# Patient Record
Sex: Male | Born: 1950 | Race: White | Hispanic: No | Marital: Married | Smoking: Former smoker
Health system: Southern US, Community
[De-identification: ages and names within clinical notes are randomized; demographics above are authoritative.]

## PROBLEM LIST (undated history)

## (undated) DIAGNOSIS — G4730 Sleep apnea, unspecified: Secondary | ICD-10-CM

## (undated) DIAGNOSIS — M1990 Unspecified osteoarthritis, unspecified site: Secondary | ICD-10-CM

## (undated) DIAGNOSIS — R51 Headache: Secondary | ICD-10-CM

## (undated) DIAGNOSIS — I10 Essential (primary) hypertension: Secondary | ICD-10-CM

## (undated) DIAGNOSIS — I499 Cardiac arrhythmia, unspecified: Secondary | ICD-10-CM

## (undated) DIAGNOSIS — R519 Headache, unspecified: Secondary | ICD-10-CM

---

## 1988-11-11 HISTORY — PX: VASECTOMY: SHX75

## 2011-11-12 HISTORY — PX: EYE SURGERY: SHX253

## 2015-07-05 ENCOUNTER — Ambulatory Visit: Payer: Worker's Compensation

## 2015-07-05 ENCOUNTER — Ambulatory Visit (INDEPENDENT_AMBULATORY_CARE_PROVIDER_SITE_OTHER): Payer: Worker's Compensation | Admitting: Family Medicine

## 2015-07-05 VITALS — BP 152/96 | HR 64 | Temp 98.6°F | Resp 17 | Ht 68.0 in | Wt 236.0 lb

## 2015-07-05 DIAGNOSIS — S61431A Puncture wound without foreign body of right hand, initial encounter: Secondary | ICD-10-CM

## 2015-07-05 DIAGNOSIS — S61209A Unspecified open wound of unspecified finger without damage to nail, initial encounter: Secondary | ICD-10-CM

## 2015-07-05 MED ORDER — CEPHALEXIN 500 MG PO CAPS
500.0000 mg | ORAL_CAPSULE | Freq: Two times a day (BID) | ORAL | Status: DC
Start: 2015-07-05 — End: 2017-08-29

## 2015-07-05 NOTE — Progress Notes (Signed)
Verbal consent obtained from patient.  Anesthesia by metacarpal block with 4cc Lidocaine 2% without epinephrine.  Wound scrubbed with soap and water and rinsed.  Wound closed with #1 4-0 Prolene loose simple interrupted suture.  Wound cleansed and dressed.

## 2015-07-05 NOTE — Progress Notes (Signed)
Subjective:  This chart was scribed for Meredith Staggers, MD by Andrew Au, ED Scribe. This patient was seen in room 9 and the patient's care was started at 9:07 AM.   Patient ID: Danny Andrews, male    DOB: August 26, 1951, 64 y.o.   MRN: [id number]  HPI Chief Complaint  Patient presents with  . Finger Injury    right finger     HPI Comments: Danny Andrews is a 64 y.o. male who presents to the [facility] for an injury that occurred at work about 2.5 hour ago. Pt works for  [employer]. 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 States he removed screw driver himself. He did have bleeding after removing screwdriver but covered wound and applied pressure. Pt is right hand dominant. Last tetanus was 5 years ago.  No Known Allergies Prior to Admission medications   Medication Sig Start Date End Date Taking? Authorizing Provider  aspirin 325 MG tablet Take 325 mg by mouth daily.   Yes Historical Provider, MD   Review of Systems  Skin: Positive for wound.  Neurological: Negative for weakness and numbness.    Objective:   Physical Exam  Constitutional: He is oriented to person, place, and time. He appears well-developed and well-nourished. No distress.  HENT:  Head: Normocephalic and atraumatic.  Eyes: Conjunctivae and EOM are normal.  Neck: Neck supple.  Pulmonary/Chest: Effort normal.  Musculoskeletal: Normal range of motion.  Neurological: He is alert and oriented to person, place, and time.  Skin: Skin is warm and dry.  At the pad of right second finger there is a  1 cm curvilinear laceration on radial aspect with a small laceration approximately 2-24mm at distal pad of finger. Senstation intact distally. Cap refill < 1 sec at tip of finger. He has intact flexion and extension at distal phalanx   Psychiatric: He has a normal mood and affect. His behavior is  normal.  Nursing note and vitals reviewed.  Filed Vitals:   07/05/15 0858  BP: 152/96  Pulse: 64  Temp: 98.6 F (37 C)  TempSrc: Oral  Resp: 17  Height: 5\' 8"  (1.727 m)  Weight: 236 lb (107.049 kg)  SpO2: 99%   [facility] reading (PRIMARY) Dr. Neva Seat. Right second finger. Degenerative changes at the IP joint. no fracture or foreign body  Assessment & Plan:   Danny Andrews is a 64 y.o. male Wound, open, finger, initial encounter - Plan: DG Finger Index Right, cephALEXin (KEFLEX) 500 MG capsule  Puncture wound, hand, right, initial encounter - Plan: DG Finger Index Right, cephALEXin (KEFLEX) 500 MG capsule  Puncture wound of right index finger. Due to injury at work today. Due to through and through puncture, decided against complete closure due to risk of infection. Start Keflex 500 mg twice a day to help prevent infection, but return to clinic precautions discussed. Single suture placed after cleansing to approximate entrance wound a little closer, but will need to heal by secondary intention. Restrictions with work included keeping wound clean and covered avoid direct pressure and use of that finger until wound is healing.  Meds ordered this encounter  Medications  . aspirin 325 MG tablet    Sig: Take 325 mg by mouth daily.  . cephALEXin (KEFLEX) 500 MG capsule    Sig: Take 1 capsule (500 mg total) by mouth 2 (two) times daily.    Dispense:  20 capsule    Refill:  0   Patient Instructions  Only one stitch placed today due to this being a puncture wound, and risk for infection. Start Keflex twice per day to lessen chance of infection, but if any increased redness swelling discharge, or increasing pain, return to recheck wound to make sure it is not looking infected. Avoid direct pressure to area and keep wound clean and covered at work. See paper provider for your employer. Recheck with Dr. Neva Seat this Saturday the 27th between 8am and 4 PM. Return to the clinic or go to the nearest  emergency room if any of your symptoms worsen or new symptoms occur.  WOUND CARE Please return in 10 days to have your stitches/staples removed or sooner if you have concerns. Return for recheck wound in 3 days. Marland Kitchen Keep area clean and dry for 24 hours. Do not remove bandage, if applied. . After 24 hours, remove bandage and wash wound gently with mild soap and warm water. Reapply a new bandage after cleaning wound, if directed. . Continue daily cleansing with soap and water until stitches/staples are removed. . Do not apply any ointments or creams to the wound while stitches/staples are in place, as this may cause delayed healing. . Notify the office if you experience any of the following signs of infection: Swelling, redness, pus drainage, streaking, fever >101.0 F . Notify the office if you experience excessive bleeding that does not stop after 15-20 minutes of constant, firm pressure.      I personally performed the services described in this documentation, which was scribed in my presence. The recorded information has been reviewed and considered, and addended by me as needed.

## 2015-07-05 NOTE — Patient Instructions (Signed)
Only one stitch placed today due to this being a puncture wound, and risk for infection. Start Keflex twice per day to lessen chance of infection, but if any increased redness swelling discharge, or increasing pain, return to recheck wound to make sure it is not looking infected. Avoid direct pressure to area and keep wound clean and covered at work. See paper provider for your employer. Recheck with Dr. Neva Seat this Saturday the 27th between 8am and 4 PM. Return to the clinic or go to the nearest emergency room if any of your symptoms worsen or new symptoms occur.  WOUND CARE Please return in 10 days to have your stitches/staples removed or sooner if you have concerns. Return for recheck wound in 3 days. Marland Kitchen Keep area clean and dry for 24 hours. Do not remove bandage, if applied. . After 24 hours, remove bandage and wash wound gently with mild soap and warm water. Reapply a new bandage after cleaning wound, if directed. . Continue daily cleansing with soap and water until stitches/staples are removed. . Do not apply any ointments or creams to the wound while stitches/staples are in place, as this may cause delayed healing. . Notify the office if you experience any of the following signs of infection: Swelling, redness, pus drainage, streaking, fever >101.0 F . Notify the office if you experience excessive bleeding that does not stop after 15-20 minutes of constant, firm pressure.

## 2015-07-06 ENCOUNTER — Ambulatory Visit (INDEPENDENT_AMBULATORY_CARE_PROVIDER_SITE_OTHER): Payer: Worker's Compensation | Admitting: Physician Assistant

## 2015-07-06 VITALS — BP 142/78 | HR 60 | Temp 98.5°F | Resp 16 | Ht 68.0 in | Wt 233.0 lb

## 2015-07-06 DIAGNOSIS — Z23 Encounter for immunization: Secondary | ICD-10-CM | POA: Diagnosis not present

## 2015-07-06 NOTE — Progress Notes (Signed)
[facility] [address], [place] [address] [phone]  Date:  07/06/2015   Name:  Danny Andrews   DOB:  Jan 01, 1951   MRN:  [id number]  PCP:  Pcp Not In System    Chief Complaint: Follow-up   History of Present Illness:  This is a 64 y.o. male who is presenting needing a tdap. He was seen here yesterday after injuring his finger at work. Sutures were placed. Pt states he was told by Dr. Neva Seat he should get a tdap if last booster was >5 years. States last tdap likely 6 or so years ago. He is worried about not getting it yesterday. He denies fever, chills, myalgias, weakness.  Review of Systems:  Review of Systems See HPI  There are no active problems to display for this patient.   Prior to Admission medications   Medication Sig Start Date End Date Taking? Authorizing Provider  aspirin 325 MG tablet Take 325 mg by mouth daily.   Yes Historical Provider, MD  cephALEXin (KEFLEX) 500 MG capsule Take 1 capsule (500 mg total) by mouth 2 (two) times daily. 07/05/15  Yes Shade Flood, MD    No Known Allergies  History reviewed. No pertinent past surgical history.  Social History  Substance Use Topics  . Smoking status: Never Smoker   . Smokeless tobacco: None  . Alcohol Use: None    History reviewed. No pertinent family history.  Medication list has been reviewed and updated.  Physical Examination:  Physical Exam  Constitutional: He is oriented to person, place, and time. He appears well-developed and well-nourished. No distress.  HENT:  Head: Normocephalic and atraumatic.  Right Ear: Hearing normal.  Left Ear: Hearing normal.  Nose: Nose normal.  Eyes: Conjunctivae and lids are normal. Right eye exhibits no discharge. Left eye exhibits no discharge. No scleral icterus.  Pulmonary/Chest: Effort normal. No respiratory distress.  Musculoskeletal: Normal range of motion.  Neurological: He is alert and oriented to person, place, and time.   Skin: Skin is warm, dry and intact. No lesion and no rash noted.  Psychiatric: He has a normal mood and affect. His speech is normal and behavior is normal. Thought content normal.    BP 142/78 mmHg  Pulse 60  Temp(Src) 98.5 F (36.9 C) (Oral)  Resp 16  Ht  (1.727 m)  Wt 233 lb (105.688 kg)  BMI 35.44 kg/m2  SpO2 98%  Assessment and Plan:  1. Need for Tdap vaccination - Tdap vaccine greater than or equal to 7yo IM   Roswell Miners. Dyke Brackett, MHS [facility]  07/06/2015

## 2015-07-08 ENCOUNTER — Ambulatory Visit (INDEPENDENT_AMBULATORY_CARE_PROVIDER_SITE_OTHER): Payer: Worker's Compensation | Admitting: Family Medicine

## 2015-07-08 VITALS — BP 132/82 | HR 81 | Temp 98.4°F | Ht 68.0 in | Wt 236.4 lb

## 2015-07-08 DIAGNOSIS — S61209S Unspecified open wound of unspecified finger without damage to nail, sequela: Secondary | ICD-10-CM | POA: Diagnosis not present

## 2015-07-08 NOTE — Progress Notes (Signed)
 @  This chart was scribed for Elvina Sidle , MD by Andrew Au, ED Scribe. This patient was seen in room 13 and the patient's care was started at 10:00 AM.  Patient ID: Danny Andrews MRN: [id number], DOB: 13-Feb-1951, 64 y.o. Date of Encounter: 07/08/2015, 9:57 AM  Primary Physician: Pcp Not In System  Chief Complaint:  Chief Complaint  Patient presents with   Wound Check    right index finger    HPI: 64 y.o. year old male with history below presents for a worker's compensations f/u. Pt was seen here 3 days ago for injury that occurred at work. Pt was using a screwdriver when his hand slipped, causing screwdriver to penetrate through right second finger. He sutures placed which he states has been healing well. He is left hand dominant.   No past medical history on file.   Home Meds: Prior to Admission medications   Medication Sig Start Date End Date Taking? Authorizing Provider  aspirin 325 MG tablet Take 325 mg by mouth daily.   Yes Historical Provider, MD  cephALEXin (KEFLEX) 500 MG capsule Take 1 capsule (500 mg total) by mouth 2 (two) times daily. 07/05/15  Yes Shade Flood, MD    Allergies: No Known Allergies  Social History   Social History   Marital Status: Married    Spouse Name: N/A   Number of Children: N/A   Years of Education: N/A   Occupational History   Not on file.   Social History Main Topics   Smoking status: Never Smoker    Smokeless tobacco: Not on file   Alcohol Use: Not on file   Drug Use: Not on file   Sexual Activity: Not on file   Other Topics Concern   Not on file   Social History Narrative     Review of Systems: Constitutional: negative for chills, fever, night sweats, weight changes, or fatigue  HEENT: negative for vision changes, hearing loss, congestion, rhinorrhea, ST, epistaxis, or sinus pressure Cardiovascular: negative for chest pain or palpitations Respiratory: negative for hemoptysis, wheezing,  shortness of breath, or cough Abdominal: negative for abdominal pain, nausea, vomiting, diarrhea, or constipation Dermatological: negative for rash Neurologic: negative for headache, dizziness, or syncope All other systems reviewed and are otherwise negative with the exception to those above and in the HPI.   Physical Exam: Blood pressure 132/82, pulse 81, temperature 98.4 F (36.9 C), temperature source Oral, height  (1.727 m), weight 236 lb 6.4 oz (107.23 kg), SpO2 98 %., Body mass index is 35.95 kg/(m^2). General: Well developed, well nourished, in no acute distress. Head: Normocephalic, atraumatic, eyes without discharge, sclera non-icteric, nares are without discharge. Bilateral auditory canals clear, TM's are without perforation, pearly grey and translucent with reflective cone of light bilaterally. Oral cavity moist, posterior pharynx without exudate, erythema, peritonsillar abscess, or post nasal drip.  Neck: Supple. No thyromegaly. Full ROM. No lymphadenopathy. Lungs: Clear bilaterally to auscultation without wheezes, rales, or rhonchi. Breathing is unlabored. Heart: RRR with S1 S2. No murmurs, rubs, or gallops appreciated. Abdomen: Soft, non-tender, non-distended with normoactive bowel sounds. No hepatomegaly. No rebound/guarding. No obvious abdominal masses. Msk:  Strength and tone normal for age. Extremities/Skin: Warm and dry. No clubbing or cyanosis. No edema. No rashes or suspicious lesions. Finger is clean. Non tender. No swelling or redness. Full ROM of right second finger.  Neuro: Alert and oriented X 3. Moves all extremities spontaneously. Gait is normal. CNII-XII grossly in tact. Psych:  Responds to  questions appropriately with a normal affect.   Labs:   ASSESSMENT AND PLAN:  64 y.o. year old male with  This chart was scribed in my presence and reviewed by me personally.    ICD-9-CM ICD-10-CM   1. Finger wound, simple, open, sequela 906.1 S61.209S       Signed, Elvina Sidle, MD    Signed, Elvina Sidle, MD 07/08/2015 9:57 AM

## 2015-07-15 ENCOUNTER — Ambulatory Visit (INDEPENDENT_AMBULATORY_CARE_PROVIDER_SITE_OTHER): Payer: Worker's Compensation | Admitting: Family Medicine

## 2015-07-15 VITALS — BP 110/86 | HR 72 | Temp 98.6°F | Resp 16 | Ht 68.0 in | Wt 236.0 lb

## 2015-07-15 DIAGNOSIS — Y990 Civilian activity done for income or pay: Secondary | ICD-10-CM

## 2015-07-15 DIAGNOSIS — S61401D Unspecified open wound of right hand, subsequent encounter: Secondary | ICD-10-CM

## 2015-07-15 DIAGNOSIS — Z4802 Encounter for removal of sutures: Secondary | ICD-10-CM

## 2015-07-15 NOTE — Patient Instructions (Signed)
Return to usual activities in life

## 2015-07-15 NOTE — Progress Notes (Signed)
Wound right index finger Subjective:  Patient ID: Danny Andrews, male    DOB: 1951/10/07  Age: 64 y.o. MRN: [id number]  Patient is here for his sutures to be removed. He has not had any problems. It is been in there for 10 days. He did get a tetanus shot with that. Objective:   Wound nicely healed. Sutures removed without difficulty  Assessment & Plan:   Assessment:  Wound right index finger  Plan:  Regular duty  There are no Patient Instructions on file for this visit.   HOPPER,DAVID, MD 07/15/2015

## 2017-08-14 ENCOUNTER — Ambulatory Visit: Payer: Self-pay | Admitting: Orthopedic Surgery

## 2017-08-14 NOTE — Progress Notes (Signed)
Please place orders in EPIC as patient is being scheduled for a pre-op appointment! Thank you! 

## 2017-08-21 ENCOUNTER — Ambulatory Visit: Payer: Self-pay | Admitting: Orthopedic Surgery

## 2017-08-21 NOTE — H&P (Signed)
Danny Andrews DOB: September 18, 1951 Single / Language: [place] / Race: White Male  H&P Date: 08/21/17  Chief Complaint: R knee pain  History of Present Illness  The patient is a 66 year old male who comes in today for a preoperative History and Physical. The patient is scheduled for a right total knee arthroplasty to be performed by Dr. Javier Docker, MD at [facility] on 08/27/2017. [name] reports ongoing chronic R knee pain refractory to medications, injections, home exercises, quad strengthening, bracing. Pain is interfering with ADLs and quality of life at this point. He desires to proceed with surgery.  Dr. Shelle Iron and the patient mutually agreed to proceed with a right total knee replacement. Risks and benefits of the procedure were discussed including stiffness, suboptimal range of motion, persistent pain, infection requiring removal of prosthesis and reinsertion, need for prophylactic antibiotics in the future, for example, dental procedures, possible need for manipulation, revision in the future and also anesthetic complications including DVT, PE, etc. We discussed the perioperative course, time in the hospital, postoperative recovery and the need for elevation to control swelling. We also discussed the predicted range of motion and the probability that squatting and kneeling would be unobtainable in the future. In addition, postoperative anticoagulation was discussed. We have obtained preoperative medical clearance as necessary. Provided illustrated handout and discussed it in detail. They will enroll in the total joint replacement educational forum at the hospital.  [facility] pre-op appt scheduled for tomorrow.  Problem List/Past Medical Hx Other tear of lateral meniscus, current injury, right knee, subsequent encounter (Z61.096E)  Encounter for care following Knee Arthroscopy (Z47.89)  Primary osteoarthritis of right knee (M17.11)  Acute pain of right knee (M25.561)  Sleep  Apnea  Allergies No Known Drug Allergies [12/27/2015]:  Family History Hypertension  brother Kidney disease  child First Degree Relatives   Social History Tobacco use  never smoker Alcohol use  current drinker; drinks beer, wine and hard liquor; less than 5 per week Children  2 Current work status  working full time Drug/Alcohol Rehab (Currently)  no Drug/Alcohol Rehab (Previously)  no Exercise  Exercises monthly; does running / walking Illicit drug use  no Living situation  live with spouse Marital status  married Pain Contract  no Tobacco / smoke exposure  no  Medication History Aspirin (  Tablet, 1 (one) Oral occasional) Active. Medications Reconciled  Past Surgical History  Vasectomy  Arthroscopic Knee Surgery - Right   Review of Systems General Not Present- Chills, Fatigue, Fever, Memory Loss, Night Sweats, Weight Gain and Weight Loss. Skin Not Present- Eczema, Hives, Itching, Lesions and Rash. HEENT Not Present- Dentures, Double Vision, Headache, Hearing Loss, Tinnitus and Visual Loss. Respiratory Not Present- Allergies, Chronic Cough, Coughing up blood, Shortness of breath at rest and Shortness of breath with exertion. Cardiovascular Not Present- Chest Pain, Difficulty Breathing Lying Down, Murmur, Palpitations, Racing/skipping heartbeats and Swelling. Gastrointestinal Not Present- Abdominal Pain, Bloody Stool, Constipation, Diarrhea, Difficulty Swallowing, Heartburn, Jaundice, Loss of appetitie, Nausea and Vomiting. Male Genitourinary Not Present- Blood in Urine, Discharge, Flank Pain, Incontinence, Painful Urination, Urgency, Urinary frequency, Urinary Retention, Urinating at Night and Weak urinary stream. Musculoskeletal Present- Joint Pain, Joint Stiffness and Joint Swelling. Not Present- Back Pain, Morning Stiffness, Muscle Pain, Muscle Weakness and Spasms. Neurological Not Present- Blackout spells, Burning, Difficulty with balance,  Dizziness, Numbness, Paralysis, Tingling, Tremor and Weakness. Psychiatric Not Present- Insomnia. Hematology Present- Easy Bleeding.  Physical Exam General Mental Status -Alert, cooperative and good historian. General Appearance-pleasant, Not  in acute distress. Orientation-Oriented X3. Build & Nutrition-Well nourished and Well developed.  Head and Neck Head-normocephalic, atraumatic . Neck Global Assessment - supple, no bruit auscultated on the right, no bruit auscultated on the left.  Eye Pupil - Bilateral-Regular and Round. Motion - Bilateral-EOMI.  Chest and Lung Exam Auscultation Breath sounds - clear at anterior chest wall and clear at posterior chest wall. Adventitious sounds - No Adventitious sounds.  Cardiovascular Auscultation Rhythm - Regular rate and rhythm. Heart Sounds - S1 WNL and S2 WNL. Murmurs & Other Heart Sounds - Auscultation of the heart reveals - No Murmurs.  Abdomen Palpation/Percussion Tenderness - Abdomen is non-tender to palpation. Rigidity (guarding) - Abdomen is soft. Auscultation Auscultation of the abdomen reveals - Bowel sounds normal.  Male Genitourinary Not done, not pertinent to present illness  Musculoskeletal On exam, varus deformity, exquisitely tender in the medial joint line, patellofemoral pain to compression. Range is -5 to 140.  Knee exam on inspection reveals no evidence of soft tissue swelling, ecchymosis, or erythema. On palpation there is no tenderness in the lateral joint line. Nontender over the fibular head or the peroneal nerve. Nontender over the quadriceps insertion of the patellar ligament insertion. Provocative maneuvers revealed a negative Lachman, negative anterior and posterior drawer and a negative McMurray. No instability was noted with varus and valgus stressing at 0 or 30 degrees. On manual motor test the quadriceps and hamstrings were 5/5. Sensory exam was intact to light touch.  Imaging X-rays  demonstrate progressive medial joint space collapse. On the AP and the lateral, there is slight varus deformity.  Assessment & Plan Primary osteoarthritis of right knee (M17.11)  Pt with end-stage knee DJD, bone-on-bone, refractory to conservative tx, scheduled for total knee replacement by Dr. Shelle Iron. We again discussed the procedure itself as well as risks, complications and alternatives, including but not limited to DVT, PE, infx, bleeding, failure of procedure, need for secondary procedure including manipulation, nerve injury, ongoing pain/symptoms, anesthesia risk, even stroke or death. Also discussed typical post-op protocols, activity restrictions, need for PT, flexion/extension exercises, time out of work. Discussed need for DVT ppx post-op per protocol. Discussed dental ppx and infx prevention. Also discussed limitations post-operatively such as kneeling and squatting. All questions were answered. Patient desires to proceed with surgery as scheduled. Will hold supplements, ASA and NSAIDs accordingly. Will remain NPO after MN night before surgery. Will present to [facility] for pre-op testing. Anticipate hospital stay to include at least 2 midnights given medical history and to ensure proper pain control. Plan ASA  BID for DVT ppx post-op. Plan pain medication, Robaxin, Colace, Miralax. Plan home with HHPT post-op with family members at home for assistance. Will follow up 10-14 days post-op for staple removal and xrays.  Plan right total knee replacement  Signed electronically by Dorothy Spark, PA-C for Dr. Shelle Iron

## 2017-08-21 NOTE — Patient Instructions (Signed)
Danny Andrews  08/21/2017   Your procedure is scheduled on: 08-27-17   Report to [facility] Main  Entrance Take [place]  Elevators to 3rd floor to  Short Stay Center at 6:00 AM.   Call this number if you have problems the morning of surgery [phone]    Remember: ONLY 1 PERSON MAY GO WITH YOU TO SHORT STAY TO GET  READY MORNING OF YOUR SURGERY.  Do not eat food or drink liquids :After Midnight.     Take these medicines the morning of surgery with A SIP OF WATER: None                                You may not have any metal on your body including hair pins and              piercings  Do not wear jewelry, lotions, powders, and  deodorant              Men may shave face and neck.   Do not bring valuables to the hospital. [facility] IS NOT             RESPONSIBLE   FOR VALUABLES.  Contacts, dentures or bridgework may not be worn into surgery.  Leave suitcase in the car. After surgery it may be brought to your room.                 Please read over the following fact sheets you were given: _____________________________________________________________________             [facility] - Preparing for Surgery Before surgery, you can play an important role.  Because skin is not sterile, your skin needs to be as free of germs as possible.  You can reduce the number of germs on your skin by washing with CHG (chlorahexidine gluconate) soap before surgery.  CHG is an antiseptic cleaner which kills germs and bonds with the skin to continue killing germs even after washing. Please DO NOT use if you have an allergy to CHG or antibacterial soaps.  If your skin becomes reddened/irritated stop using the CHG and inform your nurse when you arrive at Short Stay. Do not shave (including legs and underarms) for at least 48 hours prior to the first CHG shower.  You may shave your face/neck. Please follow these instructions carefully:  1.  Shower with CHG Soap the night before  surgery and the  morning of Surgery.  2.  If you choose to wash your hair, wash your hair first as usual with your  normal  shampoo.  3.  After you shampoo, rinse your hair and body thoroughly to remove the  shampoo.                           4.  Use CHG as you would any other liquid soap.  You can apply chg directly  to the skin and wash                       Gently with a scrungie or clean washcloth.  5.  Apply the CHG Soap to your body ONLY FROM THE NECK DOWN.   Do not use on face/ open  Wound or open sores. Avoid contact with eyes, ears mouth and genitals (private parts).                       Wash face,  Genitals (private parts) with your normal soap.             6.  Wash thoroughly, paying special attention to the area where your surgery  will be performed.  7.  Thoroughly rinse your body with warm water from the neck down.  8.  DO NOT shower/wash with your normal soap after using and rinsing off  the CHG Soap.                9.  Pat yourself dry with a clean towel.            10.  Wear clean pajamas.            11.  Place clean sheets on your bed the night of your first shower and do not  sleep with pets. Day of Surgery : Do not apply any lotions/deodorants the morning of surgery.  Please wear clean clothes to the hospital/surgery center.  FAILURE TO FOLLOW THESE INSTRUCTIONS MAY RESULT IN THE CANCELLATION OF YOUR SURGERY PATIENT SIGNATURE_________________________________  NURSE SIGNATURE__________________________________  ________________________________________________________________________

## 2017-08-21 NOTE — Progress Notes (Addendum)
07-01-17 Cardiac clearance from Kayren Eaves, NP noted in office visit,  07-01-17  (EKG) on chart.  11-09-15 (ECHO) on chart from [facility]

## 2017-08-22 ENCOUNTER — Encounter (HOSPITAL_COMMUNITY)
Admission: RE | Admit: 2017-08-22 | Discharge: 2017-08-22 | Disposition: A | Discharge status: Home / Self Care | Payer: Worker's Compensation | Source: Ambulatory Visit | Attending: Specialist | Admitting: Specialist

## 2017-08-22 ENCOUNTER — Encounter (INDEPENDENT_AMBULATORY_CARE_PROVIDER_SITE_OTHER): Payer: Self-pay

## 2017-08-22 ENCOUNTER — Encounter (HOSPITAL_COMMUNITY): Payer: Self-pay

## 2017-08-22 DIAGNOSIS — M1711 Unilateral primary osteoarthritis, right knee: Secondary | ICD-10-CM | POA: Diagnosis not present

## 2017-08-22 DIAGNOSIS — Z01812 Encounter for preprocedural laboratory examination: Secondary | ICD-10-CM | POA: Diagnosis not present

## 2017-08-22 HISTORY — DX: Sleep apnea, unspecified: G47.30

## 2017-08-22 HISTORY — DX: Headache: R51

## 2017-08-22 HISTORY — DX: Headache, unspecified: R51.9

## 2017-08-22 HISTORY — DX: Cardiac arrhythmia, unspecified: I49.9

## 2017-08-22 HISTORY — DX: Unspecified osteoarthritis, unspecified site: M19.90

## 2017-08-22 LAB — BASIC METABOLIC PANEL
Anion gap: 7 (ref 5–15)
BUN: 26 mg/dL — ABNORMAL HIGH (ref 6–20)
CHLORIDE: 104 mmol/L (ref 101–111)
CO2: 28 mmol/L (ref 22–32)
CREATININE: 0.88 mg/dL (ref 0.61–1.24)
Calcium: 9.3 mg/dL (ref 8.9–10.3)
GFR calc non Af Amer: >60 mL/min (ref >60)
Glucose, Bld: 109 mg/dL — ABNORMAL HIGH (ref 65–99)
Potassium: 4.3 mmol/L (ref 3.5–5.1)
Sodium: 139 mmol/L (ref 135–145)

## 2017-08-22 LAB — URINALYSIS, ROUTINE W REFLEX MICROSCOPIC
Bilirubin Urine: NEGATIVE
GLUCOSE, UA: NEGATIVE mg/dL
HGB URINE DIPSTICK: NEGATIVE
KETONES UR: NEGATIVE mg/dL
LEUKOCYTES UA: NEGATIVE
NITRITE: NEGATIVE
Protein, ur: NEGATIVE mg/dL
Specific Gravity, Urine: 1.018 (ref 1.005–1.030)
pH: 5 (ref 5.0–8.0)

## 2017-08-22 LAB — SURGICAL PCR SCREEN
MRSA, PCR: NEGATIVE
Staphylococcus aureus: NEGATIVE

## 2017-08-22 LAB — APTT: aPTT: 33 seconds (ref 24–36)

## 2017-08-22 LAB — CBC
HCT: 37 % — ABNORMAL LOW (ref 39.0–52.0)
Hemoglobin: 11.7 g/dL — ABNORMAL LOW (ref 13.0–17.0)
MCH: 26.2 pg (ref 26.0–34.0)
MCHC: 31.6 g/dL (ref 30.0–36.0)
MCV: 82.8 fL (ref 78.0–100.0)
PLATELETS: 305 10*3/uL (ref 150–400)
RBC: 4.47 MIL/uL (ref 4.22–5.81)
RDW: 15.1 % (ref 11.5–15.5)
WBC: 6 10*3/uL (ref 4.0–10.5)

## 2017-08-22 LAB — PROTIME-INR
INR: 0.98
Prothrombin Time: 12.9 seconds (ref 11.4–15.2)

## 2017-08-22 NOTE — Progress Notes (Signed)
08-22-17 BMP result routed to Dr. Shelle Iron for review.

## 2017-08-26 NOTE — Anesthesia Preprocedure Evaluation (Addendum)
Anesthesia Evaluation  Patient identified by MRN, date of birth, ID band Patient awake    Reviewed: Allergy & Precautions, NPO status , Patient's Chart, lab work & pertinent test results  Airway Mallampati: II  TM Distance: >3 FB Neck ROM: Full    Dental  (+) Dental Advisory Given   Pulmonary sleep apnea , former smoker,    breath sounds clear to auscultation       Cardiovascular negative cardio ROS   Rhythm:Regular Rate:Normal     Neuro/Psych negative neurological ROS     GI/Hepatic negative GI ROS, Neg liver ROS,   Endo/Other  negative endocrine ROS  Renal/GU negative Renal ROS     Musculoskeletal   Abdominal   Peds  Hematology negative hematology ROS (+)   Anesthesia Other Findings   Reproductive/Obstetrics                            Lab Results  Component Value Date   WBC 6.0 08/22/2017   HGB 11.7 (L) 08/22/2017   HCT 37.0 (L) 08/22/2017   MCV 82.8 08/22/2017   PLT 305 08/22/2017   Lab Results  Component Value Date   INR 0.98 08/22/2017   Lab Results  Component Value Date   CREATININE 0.88 08/22/2017   BUN 26 (H) 08/22/2017   NA 139 08/22/2017   K 4.3 08/22/2017   CL 104 08/22/2017   CO2 28 08/22/2017    Anesthesia Physical Anesthesia Plan  ASA: I  Anesthesia Plan: General   Post-op Pain Management:  Regional for Post-op pain   Induction: Intravenous  PONV Risk Score and Plan: 3 and Ondansetron, Dexamethasone, Midazolam and Treatment may vary due to age or medical condition  Airway Management Planned: LMA  Additional Equipment:   Intra-op Plan:   Post-operative Plan: Extubation in OR  Informed Consent: I have reviewed the patients History and Physical, chart, labs and discussed the procedure including the risks, benefits and alternatives for the proposed anesthesia with the patient or authorized representative who has indicated his/her understanding and  acceptance.   Dental advisory given  Plan Discussed with: CRNA  Anesthesia Plan Comments:        Anesthesia Quick Evaluation

## 2017-08-27 ENCOUNTER — Encounter (HOSPITAL_COMMUNITY)
Admission: RE | Disposition: A | Discharge status: Home / Self Care | Payer: Self-pay | Source: Ambulatory Visit | Attending: Specialist

## 2017-08-27 ENCOUNTER — Inpatient Hospital Stay (HOSPITAL_COMMUNITY)
Admission: RE | Admit: 2017-08-27 | Discharge: 2017-08-29 | DRG: 470 | Disposition: A | Discharge status: Home / Self Care | Payer: Worker's Compensation | Source: Ambulatory Visit | Attending: Specialist | Admitting: Specialist

## 2017-08-27 ENCOUNTER — Inpatient Hospital Stay (HOSPITAL_COMMUNITY): Payer: Worker's Compensation | Admitting: Anesthesiology

## 2017-08-27 ENCOUNTER — Inpatient Hospital Stay (HOSPITAL_COMMUNITY): Payer: Worker's Compensation

## 2017-08-27 ENCOUNTER — Encounter (HOSPITAL_COMMUNITY): Payer: Self-pay

## 2017-08-27 DIAGNOSIS — G4730 Sleep apnea, unspecified: Secondary | ICD-10-CM | POA: Diagnosis present

## 2017-08-27 DIAGNOSIS — M1731 Unilateral post-traumatic osteoarthritis, right knee: Secondary | ICD-10-CM | POA: Diagnosis present

## 2017-08-27 DIAGNOSIS — Z8249 Family history of ischemic heart disease and other diseases of the circulatory system: Secondary | ICD-10-CM | POA: Diagnosis present

## 2017-08-27 DIAGNOSIS — Z96659 Presence of unspecified artificial knee joint: Secondary | ICD-10-CM

## 2017-08-27 DIAGNOSIS — M1711 Unilateral primary osteoarthritis, right knee: Secondary | ICD-10-CM | POA: Diagnosis present

## 2017-08-27 HISTORY — PX: TOTAL KNEE ARTHROPLASTY: SHX125

## 2017-08-27 SURGERY — ARTHROPLASTY, KNEE, TOTAL
Anesthesia: General | Site: Knee | Laterality: Right

## 2017-08-27 MED ORDER — OXYCODONE-ACETAMINOPHEN 5-325 MG PO TABS: 1.0000 | ORAL_TABLET | ORAL | 0 refills | Status: DC | PRN

## 2017-08-27 MED ORDER — ACETAMINOPHEN 650 MG RE SUPP: 650.0000 mg | Freq: Four times a day (QID) | RECTAL | Status: DC | PRN

## 2017-08-27 MED ORDER — METOCLOPRAMIDE HCL 5 MG PO TABS: 5.0000 mg | ORAL_TABLET | Freq: Three times a day (TID) | ORAL | Status: DC | PRN

## 2017-08-27 MED ORDER — ALUM & MAG HYDROXIDE-SIMETH 200-200-20 MG/5ML PO SUSP: 30.0000 mL | ORAL | Status: DC | PRN

## 2017-08-27 MED ORDER — FENTANYL CITRATE (PF) 100 MCG/2ML IJ SOLN
INTRAMUSCULAR | Status: AC
  Filled 2017-08-27: qty 2

## 2017-08-27 MED ORDER — ROCURONIUM BROMIDE 10 MG/ML (PF) SYRINGE
PREFILLED_SYRINGE | INTRAVENOUS | Status: DC | PRN
  Administered 2017-08-27: 50 mg via INTRAVENOUS
  Administered 2017-08-27: 20 mg via INTRAVENOUS
  Administered 2017-08-27: 10 mg via INTRAVENOUS

## 2017-08-27 MED ORDER — SUCCINYLCHOLINE CHLORIDE 200 MG/10ML IV SOSY
PREFILLED_SYRINGE | INTRAVENOUS | Status: AC
  Filled 2017-08-27: qty 10

## 2017-08-27 MED ORDER — DIPHENHYDRAMINE HCL 12.5 MG/5ML PO ELIX: 12.5000 mg | ORAL_SOLUTION | ORAL | Status: DC | PRN

## 2017-08-27 MED ORDER — PROPOFOL 10 MG/ML IV BOLUS
INTRAVENOUS | Status: DC | PRN
  Administered 2017-08-27: 240 mg via INTRAVENOUS

## 2017-08-27 MED ORDER — DEXAMETHASONE SODIUM PHOSPHATE 10 MG/ML IJ SOLN
INTRAMUSCULAR | Status: DC | PRN
  Administered 2017-08-27: 10 mg via INTRAVENOUS

## 2017-08-27 MED ORDER — MAGNESIUM CITRATE PO SOLN: 1.0000 | Freq: Once | ORAL | Status: DC | PRN

## 2017-08-27 MED ORDER — HYDROMORPHONE HCL-NACL 0.5-0.9 MG/ML-% IV SOSY
1.0000 mg | PREFILLED_SYRINGE | INTRAVENOUS | Status: DC | PRN
  Administered 2017-08-27 (×2): 1 mg via INTRAVENOUS
  Filled 2017-08-27 (×2): qty 2

## 2017-08-27 MED ORDER — EPHEDRINE SULFATE-NACL 50-0.9 MG/10ML-% IV SOSY
PREFILLED_SYRINGE | INTRAVENOUS | Status: DC | PRN
Start: 2017-08-27 — End: 2017-08-27
  Administered 2017-08-27: 10 mg via INTRAVENOUS

## 2017-08-27 MED ORDER — DOCUSATE SODIUM 100 MG PO CAPS: 100.0000 mg | ORAL_CAPSULE | Freq: Two times a day (BID) | ORAL | 1 refills | Status: DC | PRN

## 2017-08-27 MED ORDER — LIDOCAINE 2% (20 MG/ML) 5 ML SYRINGE
INTRAMUSCULAR | Status: AC
  Filled 2017-08-27: qty 5

## 2017-08-27 MED ORDER — BISACODYL 5 MG PO TBEC: 5.0000 mg | DELAYED_RELEASE_TABLET | Freq: Every day | ORAL | Status: DC | PRN

## 2017-08-27 MED ORDER — PROPOFOL 10 MG/ML IV BOLUS
INTRAVENOUS | Status: AC
  Filled 2017-08-27: qty 40

## 2017-08-27 MED ORDER — RISAQUAD PO CAPS
1.0000 | ORAL_CAPSULE | Freq: Every day | ORAL | Status: DC
  Administered 2017-08-28 – 2017-08-29 (×2): 1 via ORAL
  Filled 2017-08-27 (×2): qty 1

## 2017-08-27 MED ORDER — ACETAMINOPHEN 10 MG/ML IV SOLN
INTRAVENOUS | Status: AC
  Filled 2017-08-27: qty 100

## 2017-08-27 MED ORDER — HYDROMORPHONE HCL-NACL 0.5-0.9 MG/ML-% IV SOSY
0.2500 mg | PREFILLED_SYRINGE | INTRAVENOUS | Status: DC | PRN
  Administered 2017-08-27 (×4): 0.5 mg via INTRAVENOUS

## 2017-08-27 MED ORDER — MIDAZOLAM HCL 5 MG/5ML IJ SOLN
INTRAMUSCULAR | Status: DC | PRN
  Administered 2017-08-27: 2 mg via INTRAVENOUS

## 2017-08-27 MED ORDER — SODIUM CHLORIDE 0.9 % IV SOLN
INTRAVENOUS | Status: AC
  Filled 2017-08-27: qty 500000

## 2017-08-27 MED ORDER — FENTANYL CITRATE (PF) 100 MCG/2ML IJ SOLN
INTRAMUSCULAR | Status: DC | PRN
  Administered 2017-08-27 (×2): 100 ug via INTRAVENOUS

## 2017-08-27 MED ORDER — METOCLOPRAMIDE HCL 5 MG/ML IJ SOLN
5.0000 mg | Freq: Three times a day (TID) | INTRAMUSCULAR | Status: DC | PRN
  Administered 2017-08-27: 10 mg via INTRAVENOUS
  Filled 2017-08-27: qty 2

## 2017-08-27 MED ORDER — SODIUM CHLORIDE 0.9 % IV SOLN
INTRAVENOUS | Status: DC | PRN
  Administered 2017-08-27: 500 mL

## 2017-08-27 MED ORDER — PROMETHAZINE HCL 25 MG PO TABS: 12.5000 mg | ORAL_TABLET | ORAL | Status: DC | PRN

## 2017-08-27 MED ORDER — TRANEXAMIC ACID 1000 MG/10ML IV SOLN
1000.0000 mg | INTRAVENOUS | Status: AC
  Administered 2017-08-27: 1000 mg via INTRAVENOUS
  Filled 2017-08-27: qty 1100

## 2017-08-27 MED ORDER — LACTATED RINGERS IV SOLN
INTRAVENOUS | Status: DC
  Administered 2017-08-27: 1000 mL via INTRAVENOUS
  Administered 2017-08-27: 10:00:00 via INTRAVENOUS

## 2017-08-27 MED ORDER — POLYETHYLENE GLYCOL 3350 17 G PO PACK: 17.0000 g | PACK | Freq: Every day | ORAL | Status: DC | PRN

## 2017-08-27 MED ORDER — KCL IN DEXTROSE-NACL 20-5-0.45 MEQ/L-%-% IV SOLN
INTRAVENOUS | Status: AC
  Administered 2017-08-27: 13:00:00 via INTRAVENOUS
  Filled 2017-08-27 (×2): qty 1000

## 2017-08-27 MED ORDER — METHOCARBAMOL 1000 MG/10ML IJ SOLN
500.0000 mg | Freq: Four times a day (QID) | INTRAVENOUS | Status: DC | PRN
  Administered 2017-08-27: 500 mg via INTRAVENOUS
  Filled 2017-08-27: qty 5

## 2017-08-27 MED ORDER — SODIUM CHLORIDE 0.9 % IR SOLN
Status: DC | PRN
  Administered 2017-08-27: 2000 mL

## 2017-08-27 MED ORDER — SUGAMMADEX SODIUM 200 MG/2ML IV SOLN
INTRAVENOUS | Status: DC | PRN
  Administered 2017-08-27: 200 mg via INTRAVENOUS

## 2017-08-27 MED ORDER — FENTANYL CITRATE (PF) 100 MCG/2ML IJ SOLN: 50.0000 ug | INTRAMUSCULAR | Status: DC | PRN

## 2017-08-27 MED ORDER — MIDAZOLAM HCL 2 MG/2ML IJ SOLN: 1.0000 mg | INTRAMUSCULAR | Status: DC | PRN

## 2017-08-27 MED ORDER — ROCURONIUM BROMIDE 50 MG/5ML IV SOSY
PREFILLED_SYRINGE | INTRAVENOUS | Status: AC
  Filled 2017-08-27: qty 5

## 2017-08-27 MED ORDER — PROMETHAZINE HCL 25 MG/ML IJ SOLN
12.5000 mg | INTRAMUSCULAR | Status: DC | PRN
  Administered 2017-08-27: 12.5 mg via INTRAVENOUS
  Filled 2017-08-27: qty 1

## 2017-08-27 MED ORDER — DEXAMETHASONE SODIUM PHOSPHATE 10 MG/ML IJ SOLN
INTRAMUSCULAR | Status: AC
Start: 2017-08-27 — End: ?
  Filled 2017-08-27: qty 1

## 2017-08-27 MED ORDER — ONDANSETRON HCL 4 MG/2ML IJ SOLN
INTRAMUSCULAR | Status: AC
  Filled 2017-08-27: qty 2

## 2017-08-27 MED ORDER — LIDOCAINE 2% (20 MG/ML) 5 ML SYRINGE
INTRAMUSCULAR | Status: DC | PRN
  Administered 2017-08-27: 100 mg via INTRAVENOUS

## 2017-08-27 MED ORDER — MENTHOL 3 MG MT LOZG: 1.0000 | LOZENGE | OROMUCOSAL | Status: DC | PRN

## 2017-08-27 MED ORDER — OXYCODONE HCL 5 MG PO TABS
ORAL_TABLET | ORAL | Status: AC
  Administered 2017-08-27: 5 mg via ORAL
  Filled 2017-08-27: qty 1

## 2017-08-27 MED ORDER — CEFAZOLIN SODIUM-DEXTROSE 2-4 GM/100ML-% IV SOLN
2.0000 g | INTRAVENOUS | Status: AC
  Administered 2017-08-27: 2 g via INTRAVENOUS

## 2017-08-27 MED ORDER — ONDANSETRON HCL 4 MG/2ML IJ SOLN
INTRAMUSCULAR | Status: DC | PRN
  Administered 2017-08-27: 4 mg via INTRAVENOUS

## 2017-08-27 MED ORDER — EPHEDRINE 5 MG/ML INJ
INTRAVENOUS | Status: AC
  Filled 2017-08-27: qty 10

## 2017-08-27 MED ORDER — PHENOL 1.4 % MT LIQD
1.0000 | OROMUCOSAL | Status: DC | PRN
  Administered 2017-08-29: 1 via OROMUCOSAL

## 2017-08-27 MED ORDER — ACETAMINOPHEN 325 MG PO TABS
650.0000 mg | ORAL_TABLET | Freq: Four times a day (QID) | ORAL | Status: DC | PRN
  Administered 2017-08-28 – 2017-08-29 (×4): 650 mg via ORAL
  Filled 2017-08-27 (×4): qty 2

## 2017-08-27 MED ORDER — CEFAZOLIN SODIUM-DEXTROSE 2-4 GM/100ML-% IV SOLN
2.0000 g | Freq: Four times a day (QID) | INTRAVENOUS | Status: AC
  Administered 2017-08-27 – 2017-08-28 (×3): 2 g via INTRAVENOUS
  Filled 2017-08-27 (×3): qty 100

## 2017-08-27 MED ORDER — ACETAMINOPHEN 10 MG/ML IV SOLN
1000.0000 mg | INTRAVENOUS | Status: AC
  Administered 2017-08-27: 1000 mg via INTRAVENOUS

## 2017-08-27 MED ORDER — ASPIRIN EC 325 MG PO TBEC
325.0000 mg | DELAYED_RELEASE_TABLET | Freq: Every day | ORAL | Status: DC
  Administered 2017-08-28 – 2017-08-29 (×2): 325 mg via ORAL
  Filled 2017-08-27 (×2): qty 1

## 2017-08-27 MED ORDER — METHOCARBAMOL 500 MG PO TABS
500.0000 mg | ORAL_TABLET | Freq: Four times a day (QID) | ORAL | Status: DC | PRN
  Administered 2017-08-27 – 2017-08-29 (×5): 500 mg via ORAL
  Filled 2017-08-27 (×5): qty 1

## 2017-08-27 MED ORDER — HYDROMORPHONE HCL-NACL 0.5-0.9 MG/ML-% IV SOSY
PREFILLED_SYRINGE | INTRAVENOUS | Status: AC
  Administered 2017-08-27: 0.5 mg via INTRAVENOUS
  Filled 2017-08-27: qty 4

## 2017-08-27 MED ORDER — ASPIRIN EC 325 MG PO TBEC: 325.0000 mg | DELAYED_RELEASE_TABLET | Freq: Two times a day (BID) | ORAL | 1 refills | Status: DC

## 2017-08-27 MED ORDER — BUPIVACAINE-EPINEPHRINE 0.25% -1:200000 IJ SOLN
INTRAMUSCULAR | Status: AC
  Filled 2017-08-27: qty 1

## 2017-08-27 MED ORDER — MSM PO POWD: Freq: Every day | ORAL | Status: DC

## 2017-08-27 MED ORDER — BUPIVACAINE-EPINEPHRINE 0.25% -1:200000 IJ SOLN
INTRAMUSCULAR | Status: DC | PRN
  Administered 2017-08-27: 50 mL

## 2017-08-27 MED ORDER — STERILE WATER FOR IRRIGATION IR SOLN
Status: DC | PRN
  Administered 2017-08-27: 2000 mL

## 2017-08-27 MED ORDER — PROMETHAZINE HCL 25 MG/ML IJ SOLN: 6.2500 mg | INTRAMUSCULAR | Status: DC | PRN

## 2017-08-27 MED ORDER — ROPIVACAINE HCL 7.5 MG/ML IJ SOLN
INTRAMUSCULAR | Status: DC | PRN
  Administered 2017-08-27: 20 mL via PERINEURAL

## 2017-08-27 MED ORDER — POLYETHYLENE GLYCOL 3350 17 G PO PACK: 17.0000 g | PACK | Freq: Every day | ORAL | 0 refills | Status: DC

## 2017-08-27 MED ORDER — SUGAMMADEX SODIUM 200 MG/2ML IV SOLN
INTRAVENOUS | Status: AC
  Filled 2017-08-27: qty 2

## 2017-08-27 MED ORDER — ONDANSETRON HCL 4 MG PO TABS
4.0000 mg | ORAL_TABLET | Freq: Four times a day (QID) | ORAL | Status: DC | PRN
  Administered 2017-08-27: 4 mg via ORAL
  Filled 2017-08-27: qty 1

## 2017-08-27 MED ORDER — ONDANSETRON HCL 4 MG/2ML IJ SOLN
4.0000 mg | Freq: Four times a day (QID) | INTRAMUSCULAR | Status: DC | PRN
  Administered 2017-08-27: 4 mg via INTRAVENOUS
  Filled 2017-08-27: qty 2

## 2017-08-27 MED ORDER — SUCCINYLCHOLINE CHLORIDE 200 MG/10ML IV SOSY
PREFILLED_SYRINGE | INTRAVENOUS | Status: DC | PRN
  Administered 2017-08-27: 120 mg via INTRAVENOUS

## 2017-08-27 MED ORDER — OXYCODONE HCL 5 MG PO TABS
5.0000 mg | ORAL_TABLET | ORAL | Status: DC | PRN
  Administered 2017-08-27 (×2): 5 mg via ORAL
  Administered 2017-08-28 (×2): 10 mg via ORAL
  Administered 2017-08-28: 5 mg via ORAL
  Administered 2017-08-28: 10 mg via ORAL
  Filled 2017-08-27 (×4): qty 2
  Filled 2017-08-27: qty 1

## 2017-08-27 MED ORDER — METHOCARBAMOL 500 MG PO TABS: 500.0000 mg | ORAL_TABLET | Freq: Four times a day (QID) | ORAL | 1 refills | Status: DC | PRN

## 2017-08-27 MED ORDER — DOCUSATE SODIUM 100 MG PO CAPS
100.0000 mg | ORAL_CAPSULE | Freq: Two times a day (BID) | ORAL | Status: DC
  Administered 2017-08-27 – 2017-08-29 (×4): 100 mg via ORAL
  Filled 2017-08-27 (×4): qty 1

## 2017-08-27 MED ORDER — MIDAZOLAM HCL 2 MG/2ML IJ SOLN
INTRAMUSCULAR | Status: AC
  Filled 2017-08-27: qty 2

## 2017-08-27 MED ORDER — CEFAZOLIN SODIUM-DEXTROSE 2-4 GM/100ML-% IV SOLN
INTRAVENOUS | Status: AC
  Filled 2017-08-27: qty 100

## 2017-08-27 SURGICAL SUPPLY — 53 items
BAG ZIPLOCK 12X15 (MISCELLANEOUS) ×2 IMPLANT
BANDAGE ACE 4X5 VEL STRL LF (GAUZE/BANDAGES/DRESSINGS) ×2 IMPLANT
BANDAGE ACE 6X5 VEL STRL LF (GAUZE/BANDAGES/DRESSINGS) ×2 IMPLANT
BLADE SAG 18X100X1.27 (BLADE) ×2 IMPLANT
BLADE SAW SGTL 11.0X1.19X90.0M (BLADE) ×2 IMPLANT
BLADE SAW SGTL 13.0X1.19X90.0M (BLADE) ×2 IMPLANT
CAPT KNEE TOTAL 3 ATTUNE ×2 IMPLANT
CEMENT HV SMART SET (Cement) ×4 IMPLANT
CLOTH 2% CHLOROHEXIDINE 3PK (PERSONAL CARE ITEMS) ×2 IMPLANT
COVER SURGICAL LIGHT HANDLE (MISCELLANEOUS) ×2 IMPLANT
CUFF TOURN SGL QUICK 34 (TOURNIQUET CUFF) ×1
CUFF TRNQT CYL 34X4X40X1 (TOURNIQUET CUFF) ×1 IMPLANT
DECANTER SPIKE VIAL GLASS SM (MISCELLANEOUS) ×2 IMPLANT
DRAPE ORTHO SPLIT 77X108 STRL (DRAPES) ×2
DRAPE SHEET LG 3/4 BI-LAMINATE (DRAPES) ×2 IMPLANT
DRAPE SURG ORHT 6 SPLT 77X108 (DRAPES) ×2 IMPLANT
DRAPE U-SHAPE 47X51 STRL (DRAPES) ×2 IMPLANT
DRESSING AQUACEL AG SP 3.5X10 (GAUZE/BANDAGES/DRESSINGS) ×1 IMPLANT
DRSG AQUACEL AG SP 3.5X10 (GAUZE/BANDAGES/DRESSINGS) ×2
DURAPREP 26ML APPLICATOR (WOUND CARE) ×2 IMPLANT
ELECT REM PT RETURN 15FT ADLT (MISCELLANEOUS) ×2 IMPLANT
EVACUATOR 1/8 PVC DRAIN (DRAIN) IMPLANT
GLOVE BIOGEL PI IND STRL 7.0 (GLOVE) ×3 IMPLANT
GLOVE BIOGEL PI IND STRL 7.5 (GLOVE) ×3 IMPLANT
GLOVE BIOGEL PI IND STRL 8 (GLOVE) ×1 IMPLANT
GLOVE BIOGEL PI INDICATOR 7.0 (GLOVE) ×3
GLOVE BIOGEL PI INDICATOR 7.5 (GLOVE) ×3
GLOVE BIOGEL PI INDICATOR 8 (GLOVE) ×1
GLOVE SURG SS PI 7.0 STRL IVOR (GLOVE) ×2 IMPLANT
GLOVE SURG SS PI 7.5 STRL IVOR (GLOVE) ×4 IMPLANT
GLOVE SURG SS PI 8.0 STRL IVOR (GLOVE) ×4 IMPLANT
GOWN STRL REUS W/TWL XL LVL3 (GOWN DISPOSABLE) ×8 IMPLANT
HANDPIECE INTERPULSE COAX TIP (DISPOSABLE) ×1
IMMOBILIZER KNEE 20 (SOFTGOODS) ×2
IMMOBILIZER KNEE 20 THIGH 36 (SOFTGOODS) ×1 IMPLANT
MANIFOLD NEPTUNE II (INSTRUMENTS) ×2 IMPLANT
PACK TOTAL KNEE CUSTOM (KITS) ×2 IMPLANT
PADDING CAST COTTON 6X4 STRL (CAST SUPPLIES) ×4 IMPLANT
POSITIONER SURGICAL ARM (MISCELLANEOUS) ×2 IMPLANT
SET HNDPC FAN SPRY TIP SCT (DISPOSABLE) ×1 IMPLANT
STAPLER VISISTAT (STAPLE) ×2 IMPLANT
SUT BONE WAX W31G (SUTURE) IMPLANT
SUT STRATAFIX 0 PDS 27 VIOLET (SUTURE) ×2
SUT VIC AB 1 CT1 27 (SUTURE) ×3
SUT VIC AB 1 CT1 27XBRD ANTBC (SUTURE) ×3 IMPLANT
SUT VIC AB 2-0 CT1 27 (SUTURE) ×3
SUT VIC AB 2-0 CT1 TAPERPNT 27 (SUTURE) ×3 IMPLANT
SUTURE STRATFX 0 PDS 27 VIOLET (SUTURE) ×1 IMPLANT
SYR 50ML LL SCALE MARK (SYRINGE) IMPLANT
TOWER CARTRIDGE SMART MIX (DISPOSABLE) ×2 IMPLANT
TRAY FOLEY W/METER SILVER 16FR (SET/KITS/TRAYS/PACK) ×2 IMPLANT
WRAP KNEE MAXI GEL POST OP (GAUZE/BANDAGES/DRESSINGS) ×2 IMPLANT
YANKAUER SUCT BULB TIP 10FT TU (MISCELLANEOUS) ×2 IMPLANT

## 2017-08-27 NOTE — Evaluation (Signed)
Physical Therapy Evaluation Patient Details Name: Danny Andrews MRN: [id number] DOB: 20-Apr-1951 Today's Date: 08/27/2017   History of Present Illness  Pt s/p R TKR  Clinical Impression  Pt s/p R TKR and presents with decreased R LE strength/ROM and post op pain limiting functional mobility.  Pt should progress to dc home with family assist.    Follow Up Recommendations DC plan and follow up therapy as arranged by surgeon    Equipment Recommendations  Rolling walker with 5" wheels    Recommendations for Other Services OT consult     Precautions / Restrictions Precautions Precautions: Fall Required Braces or Orthoses: Knee Immobilizer - Right Knee Immobilizer - Right: Discontinue once straight leg raise with < 10 degree lag Restrictions Weight Bearing Restrictions: No Other Position/Activity Restrictions: WBAT      Mobility  Bed Mobility Overal bed mobility: Needs Assistance Bed Mobility: Supine to Sit     Supine to sit: Min assist     General bed mobility comments: cues for sequence and use of L LE to self assist  Transfers Overall transfer level: Needs assistance Equipment used: Rolling walker (2 wheeled) Transfers: Sit to/from Stand Sit to Stand: Min assist;Min guard         General transfer comment: cues for LE management and use of UEs to self assist  Ambulation/Gait Ambulation/Gait assistance: Min assist;Min guard Ambulation Distance (Feet): 50 Feet Assistive device: Rolling walker (2 wheeled) Gait Pattern/deviations: Step-to pattern;Decreased step length - right;Decreased step length - left;Shuffle Gait velocity: decr Gait velocity interpretation: Below normal speed for age/gender General Gait Details: cues for sequence, posture and position from RW  Stairs            Wheelchair Mobility    Modified Rankin (Stroke Patients Only)       Balance                                             Pertinent Vitals/Pain Pain  Assessment: 0-10 Pain Score: 2  Pain Location: R Knee Pain Descriptors / Indicators: Aching;Sore Pain Intervention(s): Limited activity within patient's tolerance;Monitored during session;Premedicated before session;Ice applied    Home Living Family/patient expects to be discharged to:: Private residence Living Arrangements: Spouse/significant other Available Help at Discharge: Family Type of Home: House Home Access: Stairs to enter Entrance Stairs-Rails: None Secretary/administrator of Steps: 3 Home Layout: Two level Home Equipment: Crutches      Prior Function Level of Independence: Independent               Hand Dominance        Extremity/Trunk Assessment   Upper Extremity Assessment Upper Extremity Assessment: Overall WFL for tasks assessed    Lower Extremity Assessment Lower Extremity Assessment: RLE deficits/detail    Cervical / Trunk Assessment Cervical / Trunk Assessment: Normal  Communication   Communication: No difficulties  Cognition Arousal/Alertness: Awake/alert Behavior During Therapy: WFL for tasks assessed/performed Overall Cognitive Status: Within Functional Limits for tasks assessed                                        General Comments      Exercises Total Joint Exercises Ankle Circles/Pumps: AROM;Both;15 reps;Supine   Assessment/Plan    PT Assessment Patient needs continued PT services  PT Problem List  Decreased strength;Decreased range of motion;Decreased activity tolerance;Decreased mobility;Decreased knowledge of use of DME;Pain       PT Treatment Interventions DME instruction;Gait training;Stair training;Functional mobility training;Therapeutic activities;Therapeutic exercise;Patient/family education    PT Goals (Current goals can be found in the Care Plan section)  Acute Rehab PT Goals Patient Stated Goal: Regain IND PT Goal Formulation: With patient Time For Goal Achievement: 08/30/17 Potential to  Achieve Goals: Good    Frequency 7X/week   Barriers to discharge        Co-evaluation               AM-PAC PT "6 Clicks" Daily Activity  Outcome Measure Difficulty turning over in bed (including adjusting bedclothes, sheets and blankets)?: A Lot Difficulty moving from lying on back to sitting on the side of the bed? : A Lot Difficulty sitting down on and standing up from a chair with arms (e.g., wheelchair, bedside commode, etc,.)?: A Lot Help needed moving to and from a bed to chair (including a wheelchair)?: A Little Help needed walking in hospital room?: A Little Help needed climbing 3-5 steps with a railing? : A Little 6 Click Score: 15    End of Session Equipment Utilized During Treatment: Gait belt;Right knee immobilizer Activity Tolerance: Patient tolerated treatment well Patient left: in chair;with call bell/phone within reach;with family/visitor present Nurse Communication: Mobility status PT Visit Diagnosis: Difficulty in walking, not elsewhere classified (R26.2)    Time: [id number] PT Time Calculation (min) (ACUTE ONLY): 25 min   Charges:   PT Evaluation $PT Eval Low Complexity: 1 Low PT Treatments $Gait Training: 8-22 mins   PT G Codes:        Pg [phone]   Neville Walston 08/27/2017, 5:57 PM

## 2017-08-27 NOTE — Brief Op Note (Signed)
08/27/2017  10:18 AM  PATIENT:  Consuella LoseJohn Rensch  66 y.o. male  PRE-OPERATIVE DIAGNOSIS:  Degenerative joint disease right knee  POST-OPERATIVE DIAGNOSIS:  Degenerative joint disease right knee  PROCEDURE:  Procedure(s) with comments: RIGHT TOTAL KNEE ARTHROPLASTY (Right) - Adductor Block  SURGEON:  Surgeon(s) and Role:    Jene Every* Shristi Scheib, MD - Primary  PHYSICIAN ASSISTANT:   ASSISTANTS: [place]   ANESTHESIA:   general  EBL:  50 mL   BLOOD ADMINISTERED:none  DRAINS: none   LOCAL MEDICATIONS USED:  MARCAINE     SPECIMEN:  No Specimen  DISPOSITION OF SPECIMEN:  N/A  COUNTS:  YES  TOURNIQUET:   Total Tourniquet Time Documented: Thigh (Right) - 56 minutes Total: Thigh (Right) - 56 minutes   DICTATION: .Other Dictation: Dictation Number [id number]  PLAN OF CARE: Admit to inpatient   PATIENT DISPOSITION:  PACU - hemodynamically stable.   Delay start of Pharmacological VTE agent (>24hrs) due to surgical blood loss or risk of bleeding: no

## 2017-08-27 NOTE — Progress Notes (Signed)
PHARMACIST - PHYSICIAN ORDER COMMUNICATION  CONCERNING: P&T Medication Policy on Herbal Medications  DESCRIPTION:  This patient's order for:  methylsulfonylmethane (MSM)  has been noted.  This product(s) is classified as an "herbal" or natural product. Due to a lack of definitive safety studies or FDA approval, nonstandard manufacturing practices, plus the potential risk of unknown drug-drug interactions while on inpatient medications, the Pharmacy and Therapeutics Committee does not permit the use of "herbal" or natural products of this type within [facility].   ACTION TAKEN: The pharmacy department is unable to verify this order at this time and your patient has been informed of this safety policy. Please reevaluate patient's clinical condition at discharge and address if the herbal or natural product(s) should be resumed at that time.  Bernadene Personrew Quynn Vilchis, PharmD, BCPS Pager: [phone] 08/27/2017, 12:04 PM

## 2017-08-27 NOTE — Interval H&P Note (Signed)
History and Physical Interval Note:  08/27/2017 8:23 AM  Danny Andrews  has presented today for surgery, with the diagnosis of Degenerative joint disease right knee  The various methods of treatment have been discussed with the patient and family. After consideration of risks, benefits and other options for treatment, the patient has consented to  Procedure(s) with comments: RIGHT TOTAL KNEE ARTHROPLASTY (Right) - Adductor Block as a surgical intervention .  The patient's history has been reviewed, patient examined, no change in status, stable for surgery.  I have reviewed the patient's chart and labs.  Questions were answered to the patient's satisfaction.     Tannen Vandezande C

## 2017-08-27 NOTE — Anesthesia Postprocedure Evaluation (Signed)
Anesthesia Post Note  Patient: Danny LoseJohn Andrews  Procedure(s) Performed: RIGHT TOTAL KNEE ARTHROPLASTY (Right Knee)     Patient location during evaluation: PACU Anesthesia Type: General Level of consciousness: awake and alert Pain management: pain level controlled Vital Signs Assessment: post-procedure vital signs reviewed and stable Respiratory status: spontaneous breathing, nonlabored ventilation, respiratory function stable and patient connected to nasal cannula oxygen Cardiovascular status: blood pressure returned to baseline and stable Postop Assessment: no apparent nausea or vomiting Anesthetic complications: no    Last Vitals:  Vitals:   08/27/17 1130 08/27/17 1143  BP: 139/80 138/81  Pulse: 60 64  Resp: 15 16  Temp:  36.6 C  SpO2: 100% 97%    Last Pain:  Vitals:   08/27/17 1130  TempSrc:   PainSc: 2                  Kennieth RadFitzgerald, Tavian Callander E

## 2017-08-27 NOTE — H&P (View-Only) (Signed)
Danny Andrews DOB: June 08, 1951 Single / Language: [place] / Race: White Male  H&P Date: 08/21/17  Chief Complaint: R knee pain  History of Present Illness  The patient is a 66 year old male who comes in today for a preoperative History and Physical. The patient is scheduled for a right total knee arthroplasty to be performed by Dr. Javier DockerJeffrey C. Beane, [facility]. Eldridge reports ongoing chronic R knee pain refractory to medications, injections, home exercises, quad strengthening, bracing. Pain is interfering with ADLs and quality of life at this point. He desires to proceed with surgery.  Dr. Shelle IronBeane and the patient mutually agreed to proceed with a right total knee replacement. Risks and benefits of the procedure were discussed including stiffness, suboptimal range of motion, persistent pain, infection requiring removal of prosthesis and reinsertion, need for prophylactic antibiotics in the future, for example, dental procedures, possible need for manipulation, revision in the future and also anesthetic complications including DVT, PE, etc. We discussed the perioperative course, time in the [facility], postoperative recovery and the need for elevation to control swelling. We also discussed the predicted range of motion and the probability that squatting and kneeling would be unobtainable in the future. In addition, postoperative anticoagulation was discussed. We have obtained preoperative medical clearance as necessary. Provided illustrated handout and discussed it in detail. They will enroll in the total joint replacement educational forum at the [facility].  [facility] pre-op appt scheduled for tomorrow.  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)  Acute pain of right knee (M25.561)  Sleep  Apnea  Allergies No Known Drug Allergies [12/27/2015]:  Family History Hypertension  brother Kidney disease  child First Degree Relatives   Social History Tobacco use  never smoker Alcohol use  current drinker; drinks beer, wine and hard liquor; less than 5 per week Children  2 Current work status  working full time Drug/Alcohol Rehab (Currently)  no Drug/Alcohol Rehab (Previously)  no Exercise  Exercises monthly; does running / walking Illicit drug use  no Living situation  live with spouse Marital status  married Pain Contract  no Tobacco / smoke exposure  no  Medication History Aspirin (325MG  Tablet, 1 (one) Oral occasional) Active. Medications Reconciled  Past Surgical History  Vasectomy  Arthroscopic Knee Surgery - Right   Review of Systems General Not Present- Chills, Fatigue, Fever, Memory Loss, Night Sweats, Weight Gain and Weight Loss. Skin Not Present- Eczema, Hives, Itching, Lesions and Rash. HEENT Not Present- Dentures, Double Vision, Headache, Hearing Loss, Tinnitus and Visual Loss. Respiratory Not Present- Allergies, Chronic Cough, Coughing up blood, Shortness of breath at rest and Shortness of breath with exertion. Cardiovascular Not Present- Chest Pain, Difficulty Breathing Lying Down, Murmur, Palpitations, Racing/skipping heartbeats and Swelling. Gastrointestinal Not Present- Abdominal Pain, Bloody Stool, Constipation, Diarrhea, Difficulty Swallowing, Heartburn, Jaundice, Loss of appetitie, Nausea and Vomiting. Male Genitourinary Not Present- Blood in Urine, Discharge, Flank Pain, Incontinence, Painful Urination, Urgency, Urinary frequency, Urinary Retention, Urinating at Night and Weak urinary stream. Musculoskeletal Present- Joint Pain, Joint Stiffness and Joint Swelling. Not Present- Back Pain, Morning Stiffness, Muscle Pain, Muscle Weakness and Spasms. Neurological Not Present- Blackout spells, Burning, Difficulty with balance,  Dizziness, Numbness, Paralysis, Tingling, Tremor and Weakness. Psychiatric Not Present- Insomnia. Hematology Present- Easy Bleeding.  Physical Exam General Mental Status -Alert, cooperative and good historian. General Appearance-pleasant, Not  in acute distress. Orientation-Oriented X3. Build & Nutrition-Well nourished and Well developed.  Head and Neck Head-normocephalic, atraumatic . Neck Global Assessment - supple, no bruit auscultated on the right, no bruit auscultated on the left.  Eye Pupil - Bilateral-Regular and Round. Motion - Bilateral-EOMI.  Chest and Lung Exam Auscultation Breath sounds - clear at anterior chest wall and clear at posterior chest wall. Adventitious sounds - No Adventitious sounds.  Cardiovascular Auscultation Rhythm - Regular rate and rhythm. Heart Sounds - S1 WNL and S2 WNL. Murmurs & Other Heart Sounds - Auscultation of the heart reveals - No Murmurs.  Abdomen Palpation/Percussion Tenderness - Abdomen is non-tender to palpation. Rigidity (guarding) - Abdomen is soft. Auscultation Auscultation of the abdomen reveals - Bowel sounds normal.  Male Genitourinary Not done, not pertinent to present illness  Musculoskeletal On exam, varus deformity, exquisitely tender in the medial joint line, patellofemoral pain to compression. Range is -5 to 140.  Knee exam on inspection reveals no evidence of soft tissue swelling, ecchymosis, or erythema. On palpation there is no tenderness in the lateral joint line. Nontender over the fibular head or the peroneal nerve. Nontender over the quadriceps insertion of the patellar ligament insertion. Provocative maneuvers revealed a negative Lachman, negative anterior and posterior drawer and a negative McMurray. No instability was noted with varus and valgus stressing at 0 or 30 degrees. On manual motor test the quadriceps and hamstrings were 5/5. Sensory exam was intact to light touch.  Imaging X-rays  demonstrate progressive medial joint space collapse. On the AP and the lateral, there is slight varus deformity.  Assessment & Plan Primary osteoarthritis of right knee (M17.11)  Pt with end-stage knee DJD, bone-on-bone, refractory to conservative tx, scheduled for total knee replacement by Dr. Shelle Iron. We again discussed the procedure itself as well as risks, complications and alternatives, including but not limited to DVT, PE, infx, bleeding, failure of procedure, need for secondary procedure including manipulation, nerve injury, ongoing pain/symptoms, anesthesia risk, even stroke or death. Also discussed typical post-op protocols, activity restrictions, need for PT, flexion/extension exercises, time out of work. Discussed need for DVT ppx post-op per protocol. Discussed dental ppx and infx prevention. Also discussed limitations post-operatively such as kneeling and squatting. All questions were answered. Patient desires to proceed with surgery as scheduled. Will hold supplements, ASA and NSAIDs accordingly. Will remain NPO after MN night before surgery. Will present to [facility] for pre-op testing. Anticipate [facility] stay to include at least 2 midnights given medical history and to ensure proper pain control. Plan ASA  BID for DVT ppx post-op. Plan pain medication, Robaxin, Colace, Miralax. Plan home with HHPT post-op with family members at home for assistance. Will follow up 10-14 days post-op for staple removal and xrays.  Plan right total knee replacement  Signed electronically by Dorothy Spark, PA-C for Dr. Shelle Iron

## 2017-08-27 NOTE — Transfer of Care (Signed)
Immediate Anesthesia Transfer of Care Note  Patient: Danny Andrews  Procedure(s) Performed: RIGHT TOTAL KNEE ARTHROPLASTY (Right Knee)  Patient Location: PACU  Anesthesia Type:General and Regional  Level of Consciousness: sedated  Airway & Oxygen Therapy: Patient Spontanous Breathing and Patient connected to face mask oxygen  Post-op Assessment: Report given to RN and Post -op Vital signs reviewed and stable  Post vital signs: Reviewed and stable  Last Vitals:  Vitals:   08/27/17 0814 08/27/17 0815  BP: 132/73   Pulse: (!) 59 66  Resp: (!) 0   Temp:    SpO2: 100% 98%    Last Pain:  Vitals:   08/27/17 0605  TempSrc: Oral         Complications: No apparent anesthesia complications

## 2017-08-27 NOTE — Discharge Instructions (Signed)

## 2017-08-27 NOTE — Anesthesia Procedure Notes (Signed)
Anesthesia Regional Block: Adductor canal block   Pre-Anesthetic Checklist: ,, timeout performed, Correct Patient, Correct Site, Correct Laterality, Correct Procedure, Correct Position, site marked, Risks and benefits discussed,  Surgical consent,  Pre-op evaluation,  At surgeon's request and post-op pain management  Laterality: Right  Prep: chloraprep       Needles:  Injection technique: Single-shot  Needle Type: Echogenic Needle     Needle Length: 9cm  Needle Gauge: 21     Additional Needles:   Procedures:,,,, ultrasound used (permanent image in chart),,,,  Narrative:  Start time: 08/27/2017 8:07 AM End time: 08/27/2017 8:14 AM Injection made incrementally with aspirations every 5 mL.  Performed by: Personally  Anesthesiologist: Marcene DuosFITZGERALD, Kaja Jackowski

## 2017-08-27 NOTE — Progress Notes (Signed)
AssistedDr. Rob Fitzgerald with right, ultrasound guided, adductor canal block. Side rails up, monitors on throughout procedure. See vital signs in flow sheet. Tolerated Procedure well.  

## 2017-08-27 NOTE — Progress Notes (Signed)
Consult: Skilled nursing facility placement CSW following for SNF needs, if recommended at the time of discharge.   Vivi BarrackNicole Corin Tilly, Theresia MajorsLCSWA, MSW Clinical Social Worker  [phone] 08/27/2017  12:13 PM

## 2017-08-27 NOTE — Anesthesia Procedure Notes (Signed)
Procedure Name: Intubation Date/Time: 08/27/2017 8:35 AM Performed by: Lind Covert Pre-anesthesia Checklist: Patient identified, Emergency Drugs available, Suction available, Patient being monitored and Timeout performed Patient Re-evaluated:Patient Re-evaluated prior to induction Oxygen Delivery Method: Circle system utilized Preoxygenation: Pre-oxygenation with 100% oxygen Induction Type: IV induction Laryngoscope Size: Mac and 4 Grade View: Grade I Tube type: Oral Tube size: 7.5 mm Number of attempts: 1 Airway Equipment and Method: Stylet Placement Confirmation: ETT inserted through vocal cords under direct vision,  positive ETCO2 and breath sounds checked- equal and bilateral Secured at: 23 cm Tube secured with: Tape Dental Injury: Teeth and Oropharynx as per pre-operative assessment

## 2017-08-28 LAB — BASIC METABOLIC PANEL
Anion gap: 8 (ref 5–15)
BUN: 13 mg/dL (ref 6–20)
CALCIUM: 8.6 mg/dL — AB (ref 8.9–10.3)
CHLORIDE: 104 mmol/L (ref 101–111)
CO2: 30 mmol/L (ref 22–32)
CREATININE: 0.78 mg/dL (ref 0.61–1.24)
GFR calc Af Amer: >60 mL/min (ref >60)
Glucose, Bld: 128 mg/dL — ABNORMAL HIGH (ref 65–99)
Potassium: 4.5 mmol/L (ref 3.5–5.1)
SODIUM: 142 mmol/L (ref 135–145)

## 2017-08-28 LAB — CBC
HCT: 36.7 % — ABNORMAL LOW (ref 39.0–52.0)
Hemoglobin: 11.4 g/dL — ABNORMAL LOW (ref 13.0–17.0)
MCH: 26 pg (ref 26.0–34.0)
MCHC: 31.1 g/dL (ref 30.0–36.0)
MCV: 83.8 fL (ref 78.0–100.0)
PLATELETS: 272 10*3/uL (ref 150–400)
RBC: 4.38 MIL/uL (ref 4.22–5.81)
RDW: 14.9 % (ref 11.5–15.5)
WBC: 9.2 10*3/uL (ref 4.0–10.5)

## 2017-08-28 MED ORDER — OXYCODONE HCL 5 MG PO TABS
5.0000 mg | ORAL_TABLET | ORAL | Status: DC | PRN
  Administered 2017-08-28 – 2017-08-29 (×5): 15 mg via ORAL
  Filled 2017-08-28 (×5): qty 3

## 2017-08-28 MED ORDER — HYDROMORPHONE HCL 1 MG/ML IJ SOLN
1.0000 mg | INTRAMUSCULAR | Status: DC | PRN
  Administered 2017-08-28 (×2): 1 mg via INTRAVENOUS
  Filled 2017-08-28 (×2): qty 1

## 2017-08-28 MED ORDER — HOME MED STORE IN PYXIS: 1.0000 | Freq: Every day | Status: DC

## 2017-08-28 NOTE — Evaluation (Signed)
Occupational Therapy Evaluation Patient Details Name: Danny Andrews MRN: [id number] DOB: May 16, 1951 Today's Date: 08/28/2017    History of Present Illness Pt s/p R TKR   Clinical Impression   This 66 year old man was admitted for the above sx. All education was completed. No further OT is needed at this time    Follow Up Recommendations  No OT follow up    Equipment Recommendations  3 in 1 bedside commode    Recommendations for Other Services       Precautions / Restrictions Precautions Precautions: Fall Knee Immobilizer - Right: Discontinue once straight leg raise with < 10 degree lag Restrictions Other Position/Activity Restrictions: WBAT      Mobility Bed Mobility               General bed mobility comments: oob  Transfers   Equipment used: Rolling walker (2 wheeled)   Sit to Stand: Min guard;Supervision         General transfer comment: no cues needed on second transfer off of 3:1    Balance                                           ADL either performed or assessed with clinical judgement   ADL Overall ADL's : Needs assistance/impaired Eating/Feeding: Independent   Grooming: Supervision/safety;Standing   Upper Body Bathing: Set up;Sitting   Lower Body Bathing: Minimal assistance;Sit to/from stand   Upper Body Dressing : Set up;Sitting   Lower Body Dressing: Moderate assistance;Sit to/from stand   Toilet Transfer: Min guard;Ambulation;BSC;RW   Toileting- Clothing Manipulation and Hygiene: Supervision/safety;Sit to/from stand   Tub/ Shower Transfer: Walk-in shower;Min guard;Ambulation;Shower seat     General ADL Comments: wife present and she will assist with adls prior to work.  practiced bathroom transfers. Pt will benefit from 3:1 over commode     Vision         Perception     Praxis      Pertinent Vitals/Pain Pain Score: 4  Pain Location: R Knee Pain Descriptors / Indicators: Aching;Sore Pain  Intervention(s): Limited activity within patient's tolerance;Monitored during session;Premedicated before session;Repositioned;Ice applied     Hand Dominance     Extremity/Trunk Assessment Upper Extremity Assessment Upper Extremity Assessment: Overall WFL for tasks assessed           Communication Communication Communication: No difficulties   Cognition Arousal/Alertness: Awake/alert Behavior During Therapy: WFL for tasks assessed/performed Overall Cognitive Status: Within Functional Limits for tasks assessed                                     General Comments       Exercises     Shoulder Instructions      Home Living Family/patient expects to be discharged to:: Private residence Living Arrangements: Spouse/significant other Available Help at Discharge: Family               Bathroom Shower/Tub: Walk-in [employer] resources officershower   Bathroom Toilet: Standard     Home Equipment: Crutches;Shower seat - built in;Grab bars - tub/shower          Prior Functioning/Environment Level of Independence: Independent                 OT Problem List:        OT Treatment/Interventions:  OT Goals(Current goals can be found in the care plan section) Acute Rehab OT Goals Patient Stated Goal: Regain IND OT Goal Formulation: All assessment and education complete, DC therapy  OT Frequency:     Barriers to D/C:            Co-evaluation              AM-PAC PT "6 Clicks" Daily Activity     Outcome Measure Help from another person eating meals?: None Help from another person taking care of personal grooming?: A Little Help from another person toileting, which includes using toliet, bedpan, or urinal?: A Little Help from another person bathing (including washing, rinsing, drying)?: A Little Help from another person to put on and taking off regular upper body clothing?: A Little Help from another person to put on and taking off regular lower body clothing?:  A Lot 6 Click Score: 18   End of Session    Activity Tolerance: Patient tolerated treatment well Patient left: in chair;with call bell/phone within reach;with family/visitor present  OT Visit Diagnosis: Pain Pain - Right/Left: Right Pain - part of body: Knee                Time: 1308-6578 OT Time Calculation (min): 17 min Charges:  OT General Charges $OT Visit: 1 Visit OT Evaluation $OT Eval Low Complexity: 1 Low G-Codes:     [place], OTR/L [phone] 08/28/2017  Danny Andrews 08/28/2017, 2:19 PM

## 2017-08-28 NOTE — Progress Notes (Signed)
Physical Therapy Treatment Patient Details Name: Danny Andrews Lamke MRN: [id number] DOB: [birth year] Today's Date: 08/28/2017    History of Present Illness Pt s/p R TKR    PT Comments    POD # 1 pm session Assisted with amb a greater distance then returned to recliner to perform some TKR TE's followed by ICE.  Pt progressing well and plans to D/C to home tomorrow.    Follow Up Recommendations  DC plan and follow up therapy as arranged by surgeon     Equipment Recommendations  Rolling walker with 5" wheels    Recommendations for Other Services       Precautions / Restrictions Precautions Precautions: Fall Precaution Comments: Instructed on KI use and proper application Required Braces or Orthoses: Knee Immobilizer - Right Knee Immobilizer - Right: Discontinue once straight leg raise with < 10 degree lag Restrictions Weight Bearing Restrictions: No Other Position/Activity Restrictions: WBAT    Mobility  Bed Mobility Overal bed mobility: Needs Assistance Bed Mobility: Supine to Sit     Supine to sit: Min assist     General bed mobility comments: pt OOB in recliner  Transfers Overall transfer level: Needs assistance Equipment used: Rolling walker (2 wheeled) Transfers: Sit to/from Stand Sit to Stand: Min guard;Supervision         General transfer comment: one VC on hand placement and R LE advancement prior to sit and stand  Ambulation/Gait Ambulation/Gait assistance: Supervision;Min guard Ambulation Distance (Feet): 115 Feet Assistive device: Rolling walker (2 wheeled) Gait Pattern/deviations: Step-to pattern;Decreased step length - right;Decreased step length - left;Shuffle Gait velocity: decr   General Gait Details: 25% cues for sequence, posture and position from [employer]   Stairs            Wheelchair Mobility    Modified Rankin (Stroke Patients Only)       Balance                                            Cognition  Arousal/Alertness: Awake/alert Behavior During Therapy: WFL for tasks assessed/performed Overall Cognitive Status: Within Functional Limits for tasks assessed                                        Exercises   Total Knee Replacement TE's 10 reps B LE ankle pumps 10 reps towel squeezes 10 reps knee presses 10 reps heel slides  10 reps SLR's 10 reps ABD Followed by ICE     General Comments        Pertinent Vitals/Pain Pain Assessment: 0-10 Pain Score: 3  Pain Location: R Knee Pain Descriptors / Indicators: Aching;Sore;Operative site guarding Pain Intervention(s): Monitored during session;Repositioned;Ice applied    Home Living Family/patient expects to be discharged to:: Private residence Living Arrangements: Spouse/significant other Available Help at Discharge: Family         Home Equipment: Crutches;Shower seat - built in;Grab bars - tub/shower      Prior Function Level of Independence: Independent          PT Goals (current goals can now be found in the care plan section) Acute Rehab PT Goals Patient Stated Goal: Regain IND    Frequency    7X/week      PT Plan Current plan remains appropriate    Co-evaluation  AM-PAC PT "6 Clicks" Daily Activity  Outcome Measure  Difficulty turning over in bed (including adjusting bedclothes, sheets and blankets)?: A Little Difficulty moving from lying on back to sitting on the side of the bed? : A Little Difficulty sitting down on and standing up from a chair with arms (e.g., wheelchair, bedside commode, etc,.)?: A Little Help needed moving to and from a bed to chair (including a wheelchair)?: A Little Help needed walking in hospital room?: A Little Help needed climbing 3-5 steps with a railing? : A Little 6 Click Score: 18    End of Session Equipment Utilized During Treatment: Gait belt;Right knee immobilizer Activity Tolerance: Patient tolerated treatment well Patient left:  in chair;with call bell/phone within reach;with family/visitor present Nurse Communication: Mobility status PT Visit Diagnosis: Difficulty in walking, not elsewhere classified (R26.2)     Time: 1610-9604 PT Time Calculation (min) (ACUTE ONLY): 23 min  Charges:  $Gait Training: 8-22 mins $Therapeutic Exercise: 8-22 mins                    G Codes:       {Glinda Natzke  PTA [facility]  Acute  Rehab Pager      [phone]

## 2017-08-28 NOTE — Progress Notes (Signed)
Physical Therapy Treatment Patient Details Name: Danny Andrews MRN: [id number] DOB: Jul 04, 1951 Today's Date: 08/28/2017    History of Present Illness Pt s/p R TKR    PT Comments    POD # 1 am session Applied KI and instructed on use for amb and stairs.  Instructed on when to D/C and proper application.  Assisted OOB to amb a greater distance in hallway using RW.  Returned to room then performed all supine TKE TE's followed by ICE.   Follow Up Recommendations  DC plan and follow up therapy as arranged by surgeon     Equipment Recommendations  Rolling walker with 5" wheels    Recommendations for Other Services       Precautions / Restrictions Precautions Precautions: Fall Precaution Comments: Instructed on KI use and proper application Required Braces or Orthoses: Knee Immobilizer - Right Knee Immobilizer - Right: Discontinue once straight leg raise with < 10 degree lag Restrictions Weight Bearing Restrictions: No Other Position/Activity Restrictions: WBAT    Mobility  Bed Mobility Overal bed mobility: Needs Assistance Bed Mobility: Supine to Sit     Supine to sit: Min assist     General bed mobility comments: assist R LE and increased time  Transfers Overall transfer level: Needs assistance Equipment used: Rolling walker (2 wheeled) Transfers: Sit to/from Stand Sit to Stand: Min guard;Supervision         General transfer comment: one VC on hand placement and R LE advancement prior to sit and stand  Ambulation/Gait Ambulation/Gait assistance: Supervision;Min guard Ambulation Distance (Feet): 75 Feet Assistive device: Rolling walker (2 wheeled) Gait Pattern/deviations: Step-to pattern;Decreased step length - right;Decreased step length - left;Shuffle Gait velocity: decr   General Gait Details: 25% cues for sequence, posture and position from [employer]   Stairs            Wheelchair Mobility    Modified Rankin (Stroke Patients Only)       Balance                                             Cognition Arousal/Alertness: Awake/alert Behavior During Therapy: WFL for tasks assessed/performed Overall Cognitive Status: Within Functional Limits for tasks assessed                                        Exercises   Total Knee Replacement TE's 10 reps B LE ankle pumps 10 reps towel squeezes 10 reps knee presses 10 reps heel slides  10 reps SAQ's 10 reps SLR's 10 reps ABD Followed by ICE'    General Comments        Pertinent Vitals/Pain Pain Assessment: 0-10 Pain Score: 3  Pain Location: R Knee Pain Descriptors / Indicators: Aching;Sore;Operative site guarding Pain Intervention(s): Monitored during session;Repositioned;Ice applied    Home Living Family/patient expects to be discharged to:: Private residence Living Arrangements: Spouse/significant other Available Help at Discharge: Family         Home Equipment: Crutches;Shower seat - built in;Grab bars - tub/shower      Prior Function Level of Independence: Independent          PT Goals (current goals can now be found in the care plan section) Acute Rehab PT Goals Patient Stated Goal: Regain IND    Frequency  7X/week      PT Plan Current plan remains appropriate    Co-evaluation              AM-PAC PT "6 Clicks" Daily Activity  Outcome Measure  Difficulty turning over in bed (including adjusting bedclothes, sheets and blankets)?: A Little Difficulty moving from lying on back to sitting on the side of the bed? : A Little Difficulty sitting down on and standing up from a chair with arms (e.g., wheelchair, bedside commode, etc,.)?: A Little Help needed moving to and from a bed to chair (including a wheelchair)?: A Little Help needed walking in hospital room?: A Little Help needed climbing 3-5 steps with a railing? : A Little 6 Click Score: 18    End of Session Equipment Utilized During Treatment: Gait  belt;Right knee immobilizer Activity Tolerance: Patient tolerated treatment well Patient left: in chair;with call bell/phone within reach;with family/visitor present Nurse Communication: Mobility status PT Visit Diagnosis: Difficulty in walking, not elsewhere classified (R26.2)     Time: 1610-9604 PT Time Calculation (min) (ACUTE ONLY): 25 min  Charges:  $Gait Training: 8-22 mins $Therapeutic Exercise: 8-22 mins                    G Codes:       Felecia Shelling  PTA [facility]  Acute  Rehab Pager      [phone]

## 2017-08-28 NOTE — Op Note (Signed)
NAME:  Danny Andrews, [name]               ACCOUNT NO.:  [id number]  MEDICAL RECORD NO.:  [id number]  LOCATION:                                 FACILITY:  PHYSICIAN:  Jene EveryJeffrey Quinterrius Errington, M.D.         DATE OF BIRTH:  DATE OF PROCEDURE:  08/27/2017 DATE OF DISCHARGE:                              OPERATIVE REPORT   PREOPERATIVE DIAGNOSIS:  End-stage osteoarthrosis, medial compartment of the right knee.  POSTOPERATIVE DIAGNOSIS:  End-stage osteoarthrosis, medial compartment of the right knee.  PROCEDURE PERFORMED:  Right total knee arthroplasty utilizing Attune rotating platform, 8 femur, 8 tibia, 5-mm insert, 38 patella.  ANESTHESIA:  General.  ASSISTANT:  Lanna PocheJacqueline Bissell, PA.  HISTORY:  This is a pleasant 66 year old male who has end-stage osteoarthrosis of the knee, refractory to conservative treatment, negative effect to his activities of daily living, bone-on-bone arthrosis, medial compartment, indicated for replacement of the degenerated joint.  Risks and benefits were discussed including bleeding, infection, damage to the neurovascular structures, no change in symptoms, worsening symptoms, DVT, PE, anesthetic complications, etc.  TECHNIQUE:  With the patient in supine position after induction of adequate general anesthesia, 2 g of Kefzol, right lower extremity was prepped, draped and exsanguinated in usual sterile fashion.  Thigh tourniquet inflated to 250 mmHg.  Midline incision was then made over the knee.  Full-thickness flap developed.  Median parapatellar arthrotomy performed.  Patella everted and knee flexed.  Elevated soft tissues medially.  Tricompartmental particularly medially was noted and of the patellofemoral joint.  Osteophytes removed with a rongeur. Remnants of the medial and lateral menisci were removed as well.  Step drill utilized and the femoral canal was irrigated, 5-degree right was chosen, with 9 off the distal femur, pinned and distal femoral  cut performed.  I sized the femur to an 8 just off the lateral ridge.  This was then pinned and we measured off the anterior cortex with a Feeler gauge.  We performed anterior, posterior and chamfer cuts.  He had a long lateral ridge.  Did not notch the femur.  Posterior soft tissue was protected at all times.  I then subluxed the tibia after the cut and measured off the low side, which was medially, 4 off the moderate defect, which was 10 off the high side, bisecting the tibiotalar joint, external alignment guide, 3 degrees slope parallel to the shaft.  Pinned it, performed our tibial cut.  This was measured then to an 8.  Size and pinned our tray, harvested bone from the proximal tibia.  Then, centrally drilled it and used our punch guide.  I then turned attention back to the femur, utilizing a box cut, bisecting the canal, performing our box cut, then a trial femur drilling the lug holes at an 8 and a trial tibia with 6-mm insert.  It was reduced and then had slight flexion.  Attention turned towards the patella, measured to a 25, planed to a 15.  Medialized our drilling, our PEG holes with our trial paddle parallel to the joint.  I placed a trial patellar component, reduced it and had excellent patellofemoral tracking.  Negative anterior drawer. No instability of varus or  valgus stressing at 0-30 degrees.  Therefore, I removed all instrumentation, checked posteriorly.  Capsule was intact. Popliteus was intact.  Geniculate was cauterized.  Pulsatile lavage utilized to clean the joint and the bony surfaces.  Knee flexed, all surfaces thoroughly dried, and mixed cement on the back table in appropriate fashion.  Injected first into the tibial canal, digitally pressurizing the cement on the tibial tray was utilized and then impacted, redundant cement removed.  We cemented the femur on the prosthesis as well, redundant cement removed.  I placed a trial insert of a 5.  Reduced it, held an  axial load throughout the curing of the cement.  We then cemented and clamped the patella.  Redundant cement was removed and injected the periosteal tissues with 0.25% Marcaine with epinephrine and covered the joint.  After appropriate curing of the cement, the tourniquet was deflated at 56 minutes.  Minimal bleeding, any vessels cauterized.  Redundant cement removed.  We checked posteriorly, irrigated it copiously with antibiotic irrigation, pulsatile lavage.  Used a 5 permanent insert, reduced it, and had full extension, full flexion, good stability with varus or valgus stressing at 0-30 degrees, negative anterior drawer.  Reapproximated the patellar arthrotomy at the appropriate pre-markings with #1 Vicryl interrupted figure-of-eight sutures as well as a running Stratafix.  Again, had excellent patellofemoral tracking after this and excellent stability. Copious irrigation subcu with 2 and skin with staples.  Wound was dressed sterilely, awoken without difficulty and transported to the recovery room in satisfactory condition.  The patient tolerated the procedure well.  No complications.  Assistant, Lanna Poche, PA, was used throughout the case for patient positioning, exposure, closure.  Blood loss, 50 mL.     Jene Every, M.D.   ______________________________ Jene Every, M.D.    Cordelia Pen  D:  08/27/2017  T:  08/28/2017  Job:  [id number]

## 2017-08-28 NOTE — Discharge Summary (Signed)
Physician Discharge Summary   Patient ID: Danny Andrews MRN: [id number] DOB/AGE: [birth year] 66 y.o.  Admit date: 08/27/2017 Discharge date: 08/29/17  Primary Diagnosis: post-traumatic osteoarthritis right knee  Admission Diagnoses:  Past Medical History:  Diagnosis Date  . Arthritis   . Dysrhythmia   . Headache   . Sleep apnea    Discharge Diagnoses:   Active Problems:   Right knee DJD  Estimated body mass index is 28.21 kg/m as calculated from the following:   Height as of this encounter: 6' (1.829 m).   Weight as of this encounter: 94.3 kg (208 lb).  Procedure:  Procedure(s) (LRB): RIGHT TOTAL KNEE ARTHROPLASTY (Right)   Consults: None  HPI: see H&P Laboratory Data: Admission on 08/27/2017  Component Date Value Ref Range Status  . WBC 08/28/2017 9.2  4.0 - 10.5 K/uL Final  . RBC 08/28/2017 4.38  4.22 - 5.81 MIL/uL Final  . Hemoglobin 08/28/2017 11.4* 13.0 - 17.0 g/dL Final  . HCT 08/28/2017 36.7* 39.0 - 52.0 % Final  . MCV 08/28/2017 83.8  78.0 - 100.0 fL Final  . MCH 08/28/2017 26.0  26.0 - 34.0 pg Final  . MCHC 08/28/2017 31.1  30.0 - 36.0 g/dL Final  . RDW 08/28/2017 14.9  11.5 - 15.5 % Final  . Platelets 08/28/2017 272  150 - 400 K/uL Final  . Sodium 08/28/2017 142  135 - 145 mmol/L Final  . Potassium 08/28/2017 4.5  3.5 - 5.1 mmol/L Final  . Chloride 08/28/2017 104  101 - 111 mmol/L Final  . CO2 08/28/2017 30  22 - 32 mmol/L Final  . Glucose, Bld 08/28/2017 128* 65 - 99 mg/dL Final  . BUN 08/28/2017 13  6 - 20 mg/dL Final  . Creatinine, Ser 08/28/2017 0.78  0.61 - 1.24 mg/dL Final  . Calcium 08/28/2017 8.6* 8.9 - 10.3 mg/dL Final  . GFR calc non Af Amer 08/28/2017 >60  >60 mL/min Final  . GFR calc Af Amer 08/28/2017 >60  >60 mL/min Final   Comment: (NOTE) The eGFR has been calculated using the CKD EPI equation. This calculation has not been validated in all clinical situations. eGFR's persistently <60 mL/min signify possible Chronic Kidney Disease.   Georgiann Hahn gap 08/28/2017 8  5 - 15 Final  Hospital Outpatient Visit on 08/22/2017  Component Date Value Ref Range Status  . aPTT 08/22/2017 33  24 - 36 seconds Final  . Sodium 08/22/2017 139  135 - 145 mmol/L Final  . Potassium 08/22/2017 4.3  3.5 - 5.1 mmol/L Final  . Chloride 08/22/2017 104  101 - 111 mmol/L Final  . CO2 08/22/2017 28  22 - 32 mmol/L Final  . Glucose, Bld 08/22/2017 109* 65 - 99 mg/dL Final  . BUN 08/22/2017 26* 6 - 20 mg/dL Final  . Creatinine, Ser 08/22/2017 0.88  0.61 - 1.24 mg/dL Final  . Calcium 08/22/2017 9.3  8.9 - 10.3 mg/dL Final  . GFR calc non Af Amer 08/22/2017 >60  >60 mL/min Final  . GFR calc Af Amer 08/22/2017 >60  >60 mL/min Final   Comment: (NOTE) The eGFR has been calculated using the CKD EPI equation. This calculation has not been validated in all clinical situations. eGFR's persistently <60 mL/min signify possible Chronic Kidney Disease.   . Anion gap 08/22/2017 7  5 - 15 Final  . WBC 08/22/2017 6.0  4.0 - 10.5 K/uL Final  . RBC 08/22/2017 4.47  4.22 - 5.81 MIL/uL Final  . Hemoglobin 08/22/2017 11.7* 13.0 - 17.0  g/dL Final  . HCT 08/22/2017 37.0* 39.0 - 52.0 % Final  . MCV 08/22/2017 82.8  78.0 - 100.0 fL Final  . MCH 08/22/2017 26.2  26.0 - 34.0 pg Final  . MCHC 08/22/2017 31.6  30.0 - 36.0 g/dL Final  . RDW 08/22/2017 15.1  11.5 - 15.5 % Final  . Platelets 08/22/2017 305  150 - 400 K/uL Final  . Prothrombin Time 08/22/2017 12.9  11.4 - 15.2 seconds Final  . INR 08/22/2017 0.98   Final  . Color, Urine 08/22/2017 YELLOW  YELLOW Final  . APPearance 08/22/2017 CLEAR  CLEAR Final  . Specific Gravity, Urine 08/22/2017 1.018  1.005 - 1.030 Final  . pH 08/22/2017 5.0  5.0 - 8.0 Final  . Glucose, UA 08/22/2017 NEGATIVE  NEGATIVE mg/dL Final  . Hgb urine dipstick 08/22/2017 NEGATIVE  NEGATIVE Final  . Bilirubin Urine 08/22/2017 NEGATIVE  NEGATIVE Final  . Ketones, ur 08/22/2017 NEGATIVE  NEGATIVE mg/dL Final  . Protein, ur 08/22/2017 NEGATIVE   NEGATIVE mg/dL Final  . Nitrite 08/22/2017 NEGATIVE  NEGATIVE Final  . Leukocytes, UA 08/22/2017 NEGATIVE  NEGATIVE Final  . MRSA, PCR 08/22/2017 NEGATIVE  NEGATIVE Final  . Staphylococcus aureus 08/22/2017 NEGATIVE  NEGATIVE Final   Comment: (NOTE) The Xpert SA Assay (FDA approved for NASAL specimens in patients 81 years of age and older), is one component of a comprehensive surveillance program. It is not intended to diagnose infection nor to guide or monitor treatment.      X-Rays:Dg Knee Right Port  Result Date: 08/27/2017 CLINICAL DATA:  Status post right total knee arthroplasty. EXAM: PORTABLE RIGHT KNEE - 1-2 VIEW COMPARISON:  None. FINDINGS: The femoral and tibial components appear to be well situated. Postsurgical changes are noted anteriorly. Midline surgical staples are noted. No fracture or dislocation is noted. IMPRESSION: Status post right total knee arthroplasty. Electronically Signed   By: Marijo Conception, M.D.   On: 08/27/2017 10:57    EKG:No orders found for this or any previous visit.   Hospital Course: Trai Ells is a 66 y.o. who was admitted to [facility]. They were brought to the operating room on 08/27/2017 and underwent Procedure(s): RIGHT TOTAL KNEE ARTHROPLASTY.  Patient tolerated the procedure well and was later transferred to the recovery room and then to the orthopaedic floor for postoperative care.  They were given PO and IV analgesics for pain control following their surgery.  They were given 24 hours of postoperative antibiotics of  Anti-infectives    Start     Dose/Rate Route Frequency Ordered Stop   08/27/17 1400  ceFAZolin (ANCEF) IVPB 2g/100 mL premix     2 g 200 mL/hr over 30 Minutes Intravenous Every 6 hours 08/27/17 1150 08/28/17 0437   08/27/17 0857  polymyxin B 500,000 Units, bacitracin 50,000 Units in sodium chloride 0.9 % 500 mL irrigation  Status:  Discontinued       As needed 08/27/17 0906 08/27/17 1030   08/27/17 0753  ceFAZolin  (ANCEF) 2-4 GM/100ML-% IVPB    Comments:  Harle Stanford   : cabinet override      08/27/17 0753 08/27/17 0837   08/27/17 0630  ceFAZolin (ANCEF) IVPB 2g/100 mL premix     2 g 200 mL/hr over 30 Minutes Intravenous On call to O.R. 08/27/17 7829 08/27/17 0837     and started on DVT prophylaxis in the form of Aspirin, TED hose and SCDs.   PT and OT were ordered for total joint protocol.  Discharge planning consulted to help with postop disposition and equipment needs.  Patient had a good night on the evening of surgery.  They started to get up OOB with therapy on day one. Continued to work with therapy into day two.  By day two, the patient had progressed with therapy and meeting their goals.  Incision was healing well.  Patient was seen in rounds and was ready to go home.   Diet: Regular diet Activity:WBAT Follow-up:in 10-14 days Disposition - Home Discharged Condition: good    Allergies as of 08/28/2017      Reactions   Atorvastatin    fatigue   Lisinopril    Fatigue      Medication List    STOP taking these medications   aspirin 325 MG tablet Replaced by:  aspirin EC 325 MG tablet   cephALEXin 500 MG capsule Commonly known as:  [place]   MSM Powd     TAKE these medications   aspirin EC 325 MG tablet Take 1 tablet (325 mg total) by mouth 2 (two) times daily. Replaces:  aspirin 325 MG tablet   docusate sodium 100 MG capsule Commonly known as:  COLACE Take 1 capsule (100 mg total) by mouth 2 (two) times daily as needed for mild constipation.   ibuprofen 800 MG tablet Commonly known as:  ADVIL,MOTRIN Take 800 mg by mouth every 8 (eight) hours as needed for moderate pain.   methocarbamol 500 MG tablet Commonly known as:  ROBAXIN Take 1 tablet (500 mg total) by mouth every 6 (six) hours as needed for muscle spasms.   oxyCODONE-acetaminophen 5-325 MG tablet Commonly known as:  PERCOCET Take 1-2 tablets by mouth every 4 (four) hours as needed for severe pain.     polyethylene glycol packet Commonly known as:  MIRALAX / GLYCOLAX Take 17 g by mouth daily.   vitamin C 1000 MG tablet Take 5,000 mg by mouth daily.      Follow-up Information    Susa Day, MD Follow up in 2 week(s).   Specialty:  Orthopedic Surgery Contact information: [address] [place] [address] [phone]           Signed: Lacie Draft, PA-C Orthopaedic Surgery 08/28/2017, 7:50 AM

## 2017-08-28 NOTE — Progress Notes (Signed)
Subjective: 1 Day Post-Op Procedure(s) (LRB): RIGHT TOTAL KNEE ARTHROPLASTY (Right) Patient reports pain as 3 on 0-10 scale.    Objective: Vital signs in last 24 hours: Temp:  [97.7 F (36.5 C)-98.3 F (36.8 C)] 98.3 F (36.8 C) (10/18 0647) Pulse Rate:  [56-74] 56 (10/18 0647) Resp:  [0-22] 16 (10/18 0647) BP: (123-155)/(67-83) 144/68 (10/18 0647) SpO2:  [97 %-100 %] 97 % (10/18 0647)  Intake/Output from previous day: 10/17 0701 - 10/18 0700 In: 3735 [P.O.:1080; I.V.:2600; IV Piggyback:55] Out: 3301 [Urine:3250; Emesis/NG output:1; Blood:50] Intake/Output this shift: No intake/output data recorded.   Recent Labs  08/28/17 0556  HGB 11.4*    Recent Labs  08/28/17 0556  WBC 9.2  RBC 4.38  HCT 36.7*  PLT 272    Recent Labs  08/28/17 0556  NA 142  K 4.5  CL 104  CO2 30  BUN 13  CREATININE 0.78  GLUCOSE 128*  CALCIUM 8.6*   No results for input(s): LABPT, INR in the last 72 hours.  Neurologically intact ABD soft Neurovascular intact Dorsiflexion/Plantar flexion intact Incision: dressing C/D/I  No DVT  Assessment/Plan: 1 Day Post-Op Procedure(s) (LRB): RIGHT TOTAL KNEE ARTHROPLASTY (Right)  PT D/C tomorrow Advance diet  Tala Eber C 08/28/2017, 7:34 AM

## 2017-08-29 LAB — CBC
HEMATOCRIT: 36.2 % — AB (ref 39.0–52.0)
HEMOGLOBIN: 11.2 g/dL — AB (ref 13.0–17.0)
MCH: 26 pg (ref 26.0–34.0)
MCHC: 30.9 g/dL (ref 30.0–36.0)
MCV: 84.2 fL (ref 78.0–100.0)
Platelets: 249 10*3/uL (ref 150–400)
RBC: 4.3 MIL/uL (ref 4.22–5.81)
RDW: 15 % (ref 11.5–15.5)
WBC: 6.6 10*3/uL (ref 4.0–10.5)

## 2017-08-29 NOTE — Progress Notes (Signed)
Discharge planning, spoke with patient and spouse at bedside. Have contacted workers comp claims rep, Tilford Pillarric Gentry [phone]), faxed orders to him at [phone]). No response, continued to call multiple workers comp numbers in an attempt to connect. Finally received a callback from A. Denton LankGriffith at [facility] who is aware of the patient and needs but has not received any orders for care or DME. Faxed her d/c summary, HH orders and DME orders, she will try to help process. Awaiting call back that arrangements are made.  Needs RW and 3n1. [phone]

## 2017-08-29 NOTE — Progress Notes (Signed)
Subjective: 2 Days Post-Op Procedure(s) (LRB): RIGHT TOTAL KNEE ARTHROPLASTY (Right) Patient reports pain as mild, soreness in thigh at tourniquet location as well. Improved since yesterday. No N/V, fever, chills, numbness, tingling. No other c/o this AM. Has not yet done stairs in PT.  Objective: Vital signs in last 24 hours: Temp:  [98 F (36.7 C)-100.4 F (38 C)] 98.3 F (36.8 C) (10/19 0619) Pulse Rate:  [59-68] 68 (10/19 0619) Resp:  [16] 16 (10/19 0619) BP: (137-165)/(62-83) 165/82 (10/19 0619) SpO2:  [96 %-100 %] 96 % (10/19 0619)  Intake/Output from previous day: 10/18 0701 - 10/19 0700 In: 1505 [P.O.:1280; I.V.:225] Out: 1325 [Urine:1325] Intake/Output this shift: No intake/output data recorded.   Recent Labs  08/28/17 0556 08/29/17 0603  HGB 11.4* 11.2*    Recent Labs  08/28/17 0556 08/29/17 0603  WBC 9.2 6.6  RBC 4.38 4.30  HCT 36.7* 36.2*  PLT 272 249    Recent Labs  08/28/17 0556  NA 142  K 4.5  CL 104  CO2 30  BUN 13  CREATININE 0.78  GLUCOSE 128*  CALCIUM 8.6*   No results for input(s): LABPT, INR in the last 72 hours.  Neurologically intact ABD soft Neurovascular intact Sensation intact distally Intact pulses distally Dorsiflexion/Plantar flexion intact Incision: dressing C/D/I and no drainage No cellulitis present Compartment soft no sign of DVT  Assessment/Plan: 2 Days Post-Op Procedure(s) (LRB): RIGHT TOTAL KNEE ARTHROPLASTY (Right) Advance diet Up with therapy D/C IV fluids  Possible D/C home later today if pain remains well controlled and he progresses well with PT Will require home health PT, F2F order placed this AM Will discuss with Dr. Elissa LovettBeane  BISSELL, Dayna BarkerJACLYN M. 08/29/2017, 8:57 AM

## 2017-08-29 NOTE — Progress Notes (Signed)
Physical Therapy Treatment Patient Details Name: Danny Andrews MRN: [id number] DOB: 1951/05/16 Today's Date: 08/29/2017    History of Present Illness Pt s/p R TKR    PT Comments    POD # 2 pm session. Spouse present for stair training and also "hands on" instructed on proper application of KI, transfer safety and amb.  See mobility details below.  Pt ready for D/C to home.   Follow Up Recommendations  DC plan and follow up therapy as arranged by surgeon     Equipment Recommendations  Rolling walker with 5" wheels    Recommendations for Other Services       Precautions / Restrictions Precautions Precautions: Fall Precaution Comments: Instructed on KI for stairs and side sleeping use and proper application Required Braces or Orthoses: Knee Immobilizer - Right Restrictions Weight Bearing Restrictions: No Other Position/Activity Restrictions: WBAT    Mobility  Bed Mobility               General bed mobility comments: pt OOB in recliner  Transfers Overall transfer level: Needs assistance Equipment used: Rolling walker (2 wheeled) Transfers: Sit to/from [employer] Transfers Sit to Stand: Supervision Stand pivot transfers: Supervision       General transfer comment: one VC on hand placement and R LE advancement prior to sit and stand  Ambulation/Gait Ambulation/Gait assistance: Supervision Ambulation Distance (Feet): 165 Feet Assistive device: Rolling walker (2 wheeled) Gait Pattern/deviations: Step-to pattern;Decreased step length - right;Decreased step length - left;Shuffle Gait velocity: decr   General Gait Details: < 25% cues for sequence, posture and position from RW   Stairs Stairs: Yes   Stair Management: No rails;Step to pattern;Backwards;With walker Number of Stairs: 4 General stair comments: with spouse present for "hands on" instruction on proper tech, walker placement and sequencing  Wheelchair Mobility    Modified Rankin (Stroke  Patients Only)       Balance                                            Cognition Arousal/Alertness: Awake/alert Behavior During Therapy: WFL for tasks assessed/performed Overall Cognitive Status: Within Functional Limits for tasks assessed                                        Exercises      General Comments        Pertinent Vitals/Pain Pain Assessment: 0-10 Pain Score: 5  Pain Location: R Knee Pain Descriptors / Indicators: Aching;Sore;Operative site guarding Pain Intervention(s): Monitored during session;Repositioned;Ice applied;Premedicated before session    Home Living                      Prior Function            PT Goals (current goals can now be found in the care plan section) Progress towards PT goals: Progressing toward goals    Frequency    7X/week      PT Plan Current plan remains appropriate    Co-evaluation              AM-PAC PT "6 Clicks" Daily Activity  Outcome Measure  Difficulty turning over in bed (including adjusting bedclothes, sheets and blankets)?: A Little Difficulty moving from lying on back to sitting on the side  of the bed? : A Little Difficulty sitting down on and standing up from a chair with arms (e.g., wheelchair, bedside commode, etc,.)?: A Little Help needed moving to and from a bed to chair (including a wheelchair)?: A Little Help needed walking in hospital room?: A Little Help needed climbing 3-5 steps with a railing? : A Little 6 Click Score: 18    End of Session Equipment Utilized During Treatment: Gait belt;Right knee immobilizer Activity Tolerance: Patient tolerated treatment well Patient left: in chair;with call bell/phone within reach;with family/visitor present Nurse Communication: Mobility status PT Visit Diagnosis: Difficulty in walking, not elsewhere classified (R26.2)     Time: [id number] PT Time Calculation (min) (ACUTE ONLY): 25 min  Charges:   $Gait Training: 8-22 mins $Therapeutic Activity: 8-22 mins                    G Codes:       Felecia ShellingLori Medha Pippen  PTA [facility]  Acute  Rehab Pager      [phone]

## 2017-08-29 NOTE — Progress Notes (Signed)
Rn reviewed discharge instructions with patient and family. All questions answered.  Paperwork and prescriptions given.   Awaiting equipment from DME and home health agency approved by Workers Comp. CM aware of DME/HH needs.     Patient discharged with RW from 6E. Patient's wife will return when [employer] provides patient with needed equipment. Patient stable and ready for discharge, therefore equipment was provided to allow patient to return home safely.  CM to continue to follow up with patient, and will alert attending MD office on Monday of difficulty arranging HH and DME. Patient also informed that he may also need to start calling his WC representative.

## 2017-08-29 NOTE — Progress Notes (Signed)
Physical Therapy Treatment Patient Details Name: Danny Andrews MRN: [id number] DOB: 05/01/1951 Today's Date: 08/29/2017    History of Present Illness Pt s/p R TKR    PT Comments    POD # 2 am session Assisted with amb a greater distance in hallway without KI with instruction to increase heel strike and knee flex.  Assisted to bathroom then back to recliner and applied ICE.  Pt will need another PT session to address stairs when spouse arrives. ,  Follow Up Recommendations  DC plan and follow up therapy as arranged by surgeon     Equipment Recommendations  Rolling walker with 5" wheels    Recommendations for Other Services       Precautions / Restrictions Precautions Precautions: Fall Precaution Comments: Instructed on KI for stairs and side sleeping use and proper application Required Braces or Orthoses: Knee Immobilizer - Right Restrictions Weight Bearing Restrictions: No Other Position/Activity Restrictions: WBAT    Mobility  Bed Mobility               General bed mobility comments: pt OOB in recliner  Transfers Overall transfer level: Needs assistance Equipment used: Rolling walker (2 wheeled) Transfers: Sit to/from [employer] Transfers Sit to Stand: Supervision Stand pivot transfers: Supervision       General transfer comment: one VC on hand placement and R LE advancement prior to sit and stand  Ambulation/Gait Ambulation/Gait assistance: Supervision Ambulation Distance (Feet): 165 Feet Assistive device: Rolling walker (2 wheeled) Gait Pattern/deviations: Step-to pattern;Decreased step length - right;Decreased step length - left;Shuffle Gait velocity: decr   General Gait Details: < 25% cues for sequence, posture and position from [employer]            Wheelchair Mobility    Modified Rankin (Stroke Patients Only)       Balance                                            Cognition Arousal/Alertness:  Awake/alert Behavior During Therapy: WFL for tasks assessed/performed Overall Cognitive Status: Within Functional Limits for tasks assessed                                        Exercises      General Comments        Pertinent Vitals/Pain Pain Assessment: 0-10 Pain Score: 5  Pain Location: R Knee Pain Descriptors / Indicators: Aching;Sore;Operative site guarding Pain Intervention(s): Monitored during session;Repositioned;Ice applied;Premedicated before session    Home Living                      Prior Function            PT Goals (current goals can now be found in the care plan section) Progress towards PT goals: Progressing toward goals    Frequency    7X/week      PT Plan Current plan remains appropriate    Co-evaluation              AM-PAC PT "6 Clicks" Daily Activity  Outcome Measure  Difficulty turning over in bed (including adjusting bedclothes, sheets and blankets)?: A Little Difficulty moving from lying on back to sitting on the side of the bed? : A Little Difficulty sitting down on  and standing up from a chair with arms (e.g., wheelchair, bedside commode, etc,.)?: A Little Help needed moving to and from a bed to chair (including a wheelchair)?: A Little Help needed walking in hospital room?: A Little Help needed climbing 3-5 steps with a railing? : A Little 6 Click Score: 18    End of Session Equipment Utilized During Treatment: Gait belt;Right knee immobilizer Activity Tolerance: Patient tolerated treatment well Patient left: in chair;with call bell/phone within reach;with family/visitor present Nurse Communication: Mobility status PT Visit Diagnosis: Difficulty in walking, not elsewhere classified (R26.2)     Time: 1005-1030 PT Time Calculation (min) (ACUTE ONLY): 25 min  Charges:  $Gait Training: 8-22 mins $Therapeutic Activity: 8-22 mins                    G Codes:       Felecia ShellingLori Leanard Dimaio  PTA [facility]  Acute   Rehab Pager      [phone]

## 2019-01-28 IMAGING — DX DG KNEE 1-2V PORT*R*
2 series · 2 of 2 positions shown · non-contrast
Comparison: None.

CLINICAL DATA: Status post right total knee arthroplasty.

EXAM:
PORTABLE RIGHT KNEE - 1-2 VIEW

[knee ap]
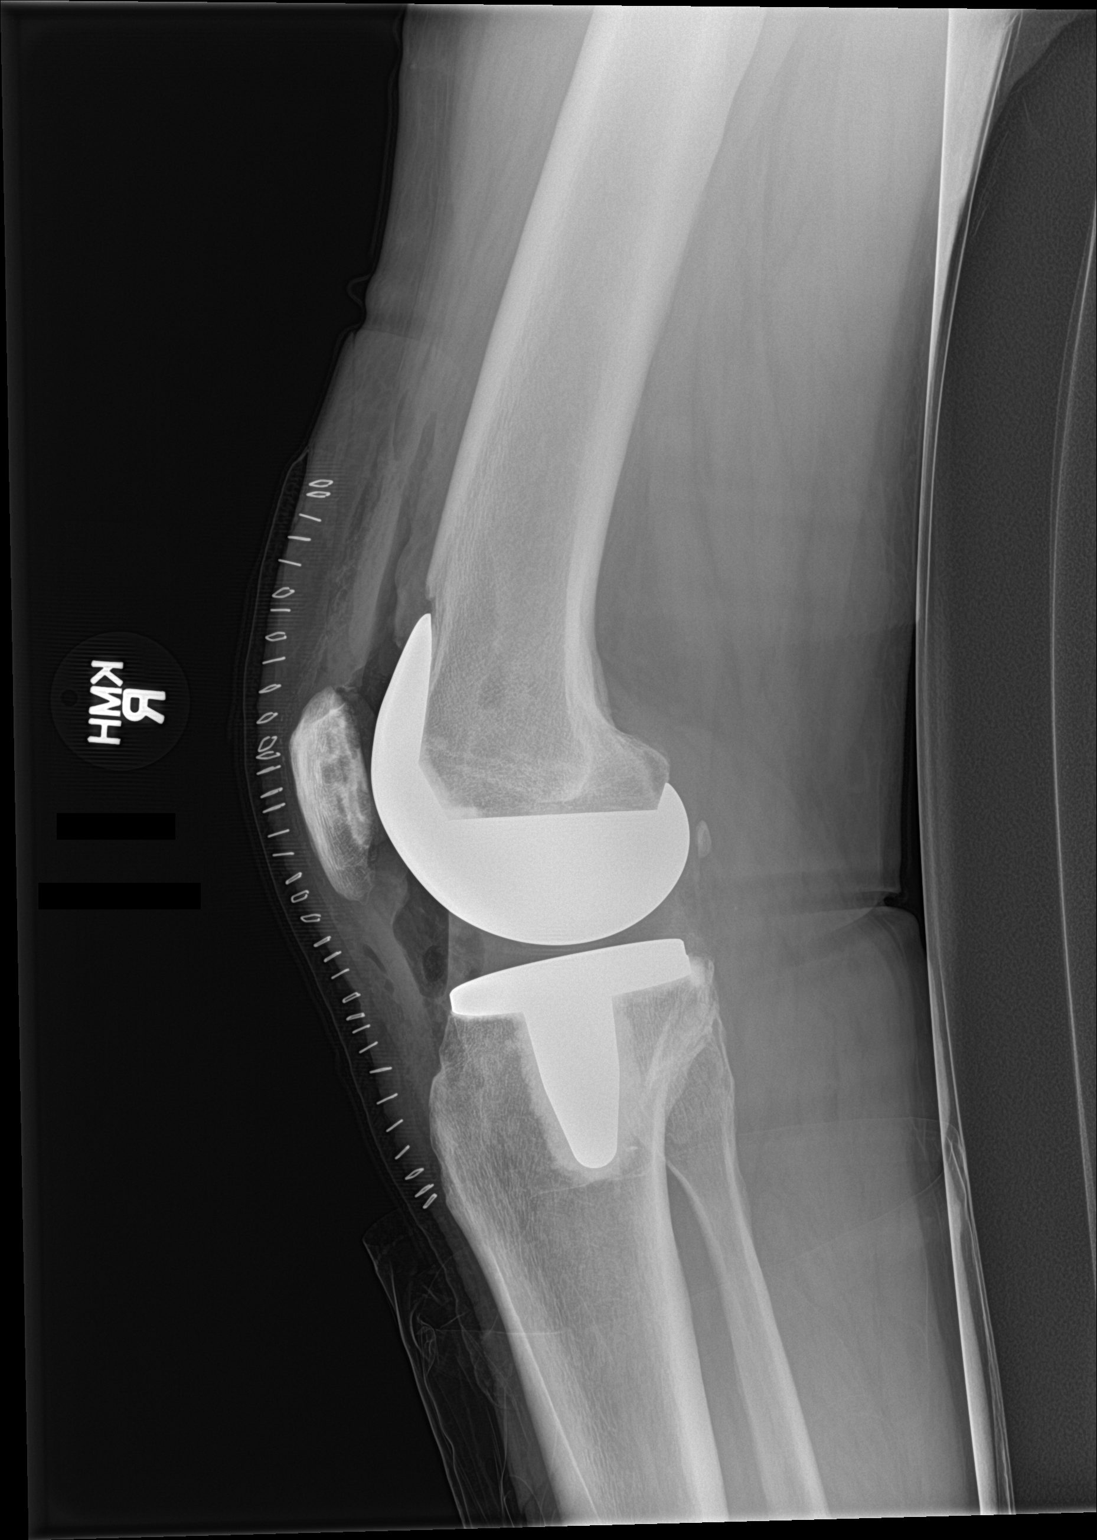

[knee lat]
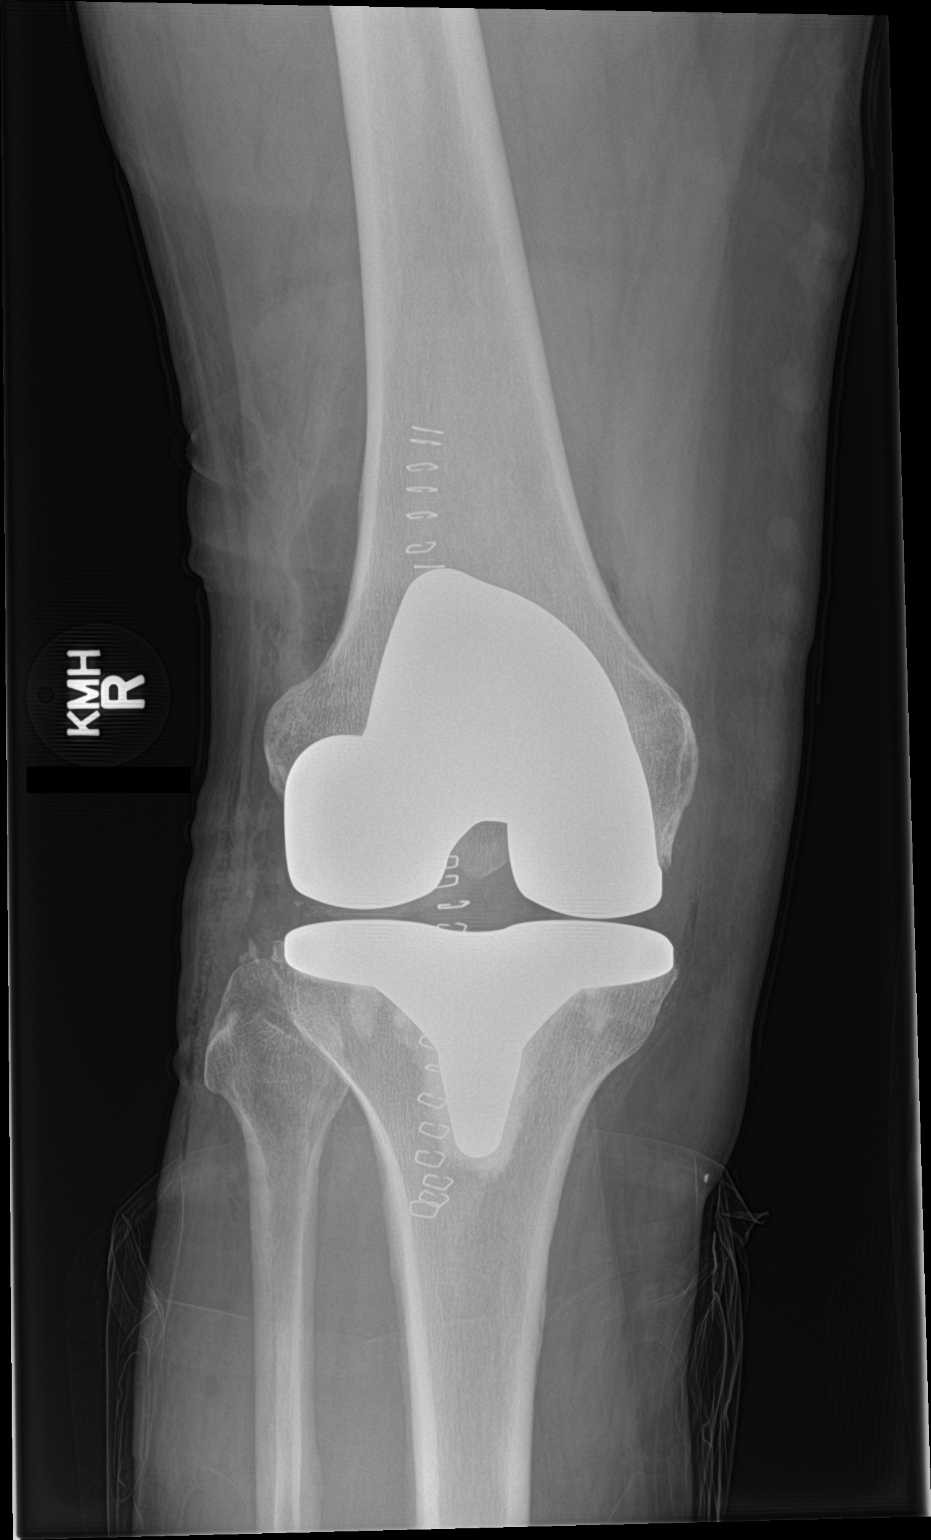

[2 of 2 positions shown; findings below may reference images not displayed]

FINDINGS: The femoral and tibial components appear to be well situated.
Postsurgical changes are noted anteriorly. Midline surgical staples
are noted. No fracture or dislocation is noted.
IMPRESSION: Status post right total knee arthroplasty.

## 2022-07-26 ENCOUNTER — Ambulatory Visit: Payer: Self-pay | Admitting: Orthopedic Surgery

## 2022-08-02 NOTE — Patient Instructions (Signed)
DUE TO COVID-19 ONLY TWO VISITORS  (aged 72 and older)  ARE ALLOWED TO COME WITH YOU AND STAY IN THE WAITING ROOM ONLY DURING PRE OP AND PROCEDURE.   **NO VISITORS ARE ALLOWED IN THE SHORT STAY AREA OR RECOVERY ROOM!!**  IF YOU WILL BE ADMITTED INTO THE HOSPITAL YOU ARE ALLOWED ONLY FOUR SUPPORT PEOPLE DURING VISITATION HOURS ONLY (7 AM -8PM)   The support person(s) must pass our screening, gel in and out, and wear a mask at all times, including in the patient's room. Patients must also wear a mask when staff or their support person are in the room. Visitors GUEST BADGE MUST BE WORN VISIBLY  One adult visitor may remain with you overnight and MUST be in the room by 8 P.M.     Your procedure is scheduled on: 08/16/22   Report to [facility] Main Entrance    Report to admitting at : 9:30 AM   Call this number if you have problems the morning of surgery [phone]   Do not eat food :After Midnight.   After Midnight you may have the following liquids until: 9:00 AM DAY OF SURGERY  Water Black Coffee (sugar ok, NO MILK/CREAM OR CREAMERS)  Tea (sugar ok, NO MILK/CREAM OR CREAMERS) regular and decaf                             Plain Jell-O (NO RED)                                           Fruit ices (not with fruit pulp, NO RED)                                     Popsicles (NO RED)                                                                  Juice: apple, WHITE grape, WHITE cranberry Sports drinks like Gatorade (NO RED)              Drink  Ensure drink AT : 9:00 AM the day of surgery.     The day of surgery:  Drink ONE (1) Pre-Surgery Clear Ensure or G2 at AM the morning of surgery. Drink in one sitting. Do not sip.  This drink was given to you during your hospital  pre-op appointment visit. Nothing else to drink after completing the  Pre-Surgery Clear Ensure or G2.          If you have questions, please contact your surgeon's office.    Oral Hygiene is also  important to reduce your risk of infection.                                    Remember - BRUSH YOUR TEETH THE MORNING OF SURGERY WITH YOUR REGULAR TOOTHPASTE   Do NOT smoke after Midnight   Take these medicines the morning of surgery with A  SIP OF WATER: N/A  DO NOT TAKE ANY ORAL DIABETIC MEDICATIONS DAY OF YOUR SURGERY  Bring CPAP mask and tubing day of surgery.                              You may not have any metal on your body including hair pins, jewelry, and body piercing             Do not wear lotions, powders, perfumes/cologne, or deodorant              Men may shave face and neck.   Do not bring valuables to the hospital. [place].   Contacts, dentures or bridgework may not be worn into surgery.   Bring small overnight bag day of surgery.   DO NOT [place]. PHARMACY WILL DISPENSE MEDICATIONS LISTED ON YOUR MEDICATION LIST TO YOU DURING YOUR ADMISSION [place]!    Patients discharged on the day of surgery will not be allowed to drive home.  Someone NEEDS to stay with you for the first 24 hours after anesthesia.   Special Instructions: Bring a copy of your healthcare power of attorney and living will documents         the day of surgery if you haven't scanned them before.              Please read over the following fact sheets you were given: IF YOU HAVE QUESTIONS ABOUT YOUR PRE-OP INSTRUCTIONS PLEASE CALL [phone]     [facility] - Preparing for Surgery Before surgery, you can play an important role.  Because skin is not sterile, your skin needs to be as free of germs as possible.  You can reduce the number of germs on your skin by washing with CHG (chlorahexidine gluconate) soap before surgery.  CHG is an antiseptic cleaner which kills germs and bonds with the skin to continue killing germs even after washing. Please DO NOT use if you have an allergy to CHG or antibacterial  soaps.  If your skin becomes reddened/irritated stop using the CHG and inform your nurse when you arrive at Short Stay. Do not shave (including legs and underarms) for at least 48 hours prior to the first CHG shower.  You may shave your face/neck. Please follow these instructions carefully:  1.  Shower with CHG Soap the night before surgery and the  morning of Surgery.  2.  If you choose to wash your hair, wash your hair first as usual with your  normal  shampoo.  3.  After you shampoo, rinse your hair and body thoroughly to remove the  shampoo.                           4.  Use CHG as you would any other liquid soap.  You can apply chg directly  to the skin and wash                       Gently with a scrungie or clean washcloth.  5.  Apply the CHG Soap to your body ONLY FROM THE NECK DOWN.   Do not use on face/ open  Wound or open sores. Avoid contact with eyes, ears mouth and genitals (private parts).                       Wash face,  Genitals (private parts) with your normal soap.             6.  Wash thoroughly, paying special attention to the area where your surgery  will be performed.  7.  Thoroughly rinse your body with warm water from the neck down.  8.  DO NOT shower/wash with your normal soap after using and rinsing off  the CHG Soap.                9.  Pat yourself dry with a clean towel.            10.  Wear clean pajamas.            11.  Place clean sheets on your bed the night of your first shower and do not  sleep with pets. Day of Surgery : Do not apply any lotions/deodorants the morning of surgery.  Please wear clean clothes to the hospital/surgery center.  FAILURE TO FOLLOW THESE INSTRUCTIONS MAY RESULT IN THE CANCELLATION OF YOUR SURGERY PATIENT SIGNATURE_________________________________  NURSE SIGNATURE__________________________________  ________________________________________________________________________   Adam Phenix  An  incentive spirometer is a tool that can help keep your lungs clear and active. This tool measures how well you are filling your lungs with each breath. Taking long deep breaths may help reverse or decrease the chance of developing breathing (pulmonary) problems (especially infection) following: A long period of time when you are unable to move or be active. BEFORE THE PROCEDURE  If the spirometer includes an indicator to show your best effort, your nurse or respiratory therapist will set it to a desired goal. If possible, sit up straight or lean slightly forward. Try not to slouch. Hold the incentive spirometer in an upright position. INSTRUCTIONS FOR USE  Sit on the edge of your bed if possible, or sit up as far as you can in bed or on a chair. Hold the incentive spirometer in an upright position. Breathe out normally. Place the mouthpiece in your mouth and seal your lips tightly around it. Breathe in slowly and as deeply as possible, raising the piston or the ball toward the top of the column. Hold your breath for 3-5 seconds or for as long as possible. Allow the piston or ball to fall to the bottom of the column. Remove the mouthpiece from your mouth and breathe out normally. Rest for a few seconds and repeat Steps 1 through 7 at least 10 times every 1-2 hours when you are awake. Take your time and take a few normal breaths between deep breaths. The spirometer may include an indicator to show your best effort. Use the indicator as a goal to work toward during each repetition. After each set of 10 deep breaths, practice coughing to be sure your lungs are clear. If you have an incision (the cut made at the time of surgery), support your incision when coughing by placing a pillow or rolled up towels firmly against it. Once you are able to get out of bed, walk around indoors and cough well. You may stop using the incentive spirometer when instructed by your caregiver.  RISKS AND COMPLICATIONS Take  your time so you do not get dizzy or light-headed. If you are in pain, you may need to take or  ask for pain medication before doing incentive spirometry. It is harder to take a deep breath if you are having pain. AFTER USE Rest and breathe slowly and easily. It can be helpful to keep track of a log of your progress. Your caregiver can provide you with a simple table to help with this. If you are using the spirometer at home, follow these instructions: [place] IF:  You are having difficultly using the spirometer. You have trouble using the spirometer as often as instructed. Your pain medication is not giving enough relief while using the spirometer. You develop fever of 100.5 F (38.1 C) or higher. SEEK IMMEDIATE MEDICAL CARE IF:  You cough up bloody sputum that had not been present before. You develop fever of 102 F (38.9 C) or greater. You develop worsening pain at or near the incision site. MAKE SURE YOU:  Understand these instructions. Will watch your condition. Will get help right away if you are not doing well or get worse. Document Released: 03/10/2007 Document Revised: 01/20/2012 Document Reviewed: 05/11/2007 [facility] Patient Information 2014 [place], [place].   ________________________________________________________________________

## 2022-08-05 ENCOUNTER — Encounter (HOSPITAL_COMMUNITY): Payer: Self-pay

## 2022-08-05 ENCOUNTER — Encounter (HOSPITAL_COMMUNITY)
Admission: RE | Admit: 2022-08-05 | Discharge: 2022-08-05 | Disposition: A | Discharge status: Home / Self Care | Payer: No Typology Code available for payment source | Source: Ambulatory Visit | Attending: Specialist | Admitting: Specialist

## 2022-08-05 ENCOUNTER — Other Ambulatory Visit: Payer: Self-pay

## 2022-08-05 VITALS — BP 143/92 | HR 63 | Temp 98.1°F | Ht 72.0 in | Wt 235.0 lb

## 2022-08-05 DIAGNOSIS — Z01818 Encounter for other preprocedural examination: Secondary | ICD-10-CM | POA: Diagnosis not present

## 2022-08-05 DIAGNOSIS — I2510 Atherosclerotic heart disease of native coronary artery without angina pectoris: Secondary | ICD-10-CM | POA: Diagnosis not present

## 2022-08-05 HISTORY — DX: Essential (primary) hypertension: I10

## 2022-08-05 LAB — SURGICAL PCR SCREEN
MRSA, PCR: NEGATIVE
Staphylococcus aureus: NEGATIVE

## 2022-08-05 LAB — BASIC METABOLIC PANEL
Anion gap: 9 (ref 5–15)
BUN: 23 mg/dL (ref 8–23)
CO2: 26 mmol/L (ref 22–32)
Calcium: 9.9 mg/dL (ref 8.9–10.3)
Chloride: 105 mmol/L (ref 98–111)
Creatinine, Ser: 0.88 mg/dL (ref 0.61–1.24)
GFR, Estimated: >60 mL/min (ref >60)
Glucose, Bld: 109 mg/dL — ABNORMAL HIGH (ref 70–99)
Potassium: 4.4 mmol/L (ref 3.5–5.1)
Sodium: 140 mmol/L (ref 135–145)

## 2022-08-05 LAB — CBC
HCT: 43.1 % (ref 39.0–52.0)
Hemoglobin: 14.4 g/dL (ref 13.0–17.0)
MCH: 31.2 pg (ref 26.0–34.0)
MCHC: 33.4 g/dL (ref 30.0–36.0)
MCV: 93.3 fL (ref 80.0–100.0)
Platelets: 280 10*3/uL (ref 150–400)
RBC: 4.62 MIL/uL (ref 4.22–5.81)
RDW: 12.3 % (ref 11.5–15.5)
WBC: 6.3 10*3/uL (ref 4.0–10.5)
nRBC: 0 % (ref 0.0–0.2)

## 2022-08-05 NOTE — Progress Notes (Signed)
For Short Stay: [place] appointment date: Date of COVID positive in last 55 days:  Bowel Prep reminder:   For Anesthesia: PCP - [name]: Dr. [name] Cardiologist - Dr. [name]. LOV: 12/15/17  Chest x-ray -  EKG -  Stress Test -  ECHO -  Cardiac Cath -  Pacemaker/ICD device last checked: Pacemaker orders received: Device Rep notified:  Spinal Cord Stimulator:  Sleep Study - Yes CPAP - Yes  Fasting Blood Sugar -  Checks Blood Sugar _____ times a day Date and result of last Hgb A1c-  Blood Thinner Instructions: Aspirin Instructions: Last Dose:  Activity level: Can go up a flight of stairs and activities of daily living without stopping and without chest pain and/or shortness of breath   Able to exercise without chest pain and/or shortness of breath   Unable to go up a flight of stairs without chest pain and/or shortness of breath     Anesthesia review: Hx: OSA(CPAP),PVC's  Patient denies shortness of breath, fever, cough and chest pain at PAT appointment   Patient verbalized understanding of instructions that were given to them at the PAT appointment. Patient was also instructed that they will need to review over the PAT instructions again at home before surgery.

## 2022-08-14 ENCOUNTER — Ambulatory Visit: Payer: Self-pay | Admitting: Orthopedic Surgery

## 2022-08-14 NOTE — H&P (View-Only) (Signed)
Danny Andrews is an 71 y.o. male.   Chief Complaint: left knee pain HPI: Patient is here for his H&P. Patient is scheduled for a left total knee replacement by Dr. Shelle Iron on 08/16/22 at [facility].  Dr. Shelle Iron and the patient mutually agreed to proceed with a total knee replacement. Risks and benefits of the procedure were discussed including stiffness, suboptimal range of motion, persistent pain, infection requiring removal of prosthesis and reinsertion, need for prophylactic antibiotics in the future, for example, dental procedures, possible need for manipulation, revision in the future and also anesthetic complications including DVT, PE, etc. We discussed the perioperative course, time in the hospital, postoperative recovery and the need for elevation to control swelling. We also discussed the predicted range of motion and the probability that squatting and kneeling would be unobtainable in the future. In addition, postoperative anticoagulation was discussed. We have obtained preoperative medical clearance as necessary. Provided illustrated handout and discussed it in detail. They will enroll in the total joint replacement educational forum at the hospital.  He was cleared by his PCP. He has been holding aspirin for several weeks. He has all his equipment from when we replaced his right knee. Did not have any issues with postop nausea and vomiting previously.  Past Medical History:  Diagnosis Date   Arthritis    Dysrhythmia    Headache    Hypertension    Sleep apnea     Past Surgical History:  Procedure Laterality Date   EYE SURGERY Bilateral 2013   Cataracts   TOTAL KNEE ARTHROPLASTY Right 08/27/2017   Procedure: RIGHT TOTAL KNEE ARTHROPLASTY;  Surgeon: Danny Every, MD;  Location: [facility];  Service: Orthopedics;  Laterality: Right;  Adductor Block   VASECTOMY  1990    No family history on file. Social History:  reports that he quit smoking about 53 years ago. His smoking use  included cigarettes. He has quit using smokeless tobacco. He reports current alcohol use of about 2.0 standard drinks of alcohol per week. He reports that he does not use drugs.  Allergies:  Allergies  Allergen Reactions   Atorvastatin     fatigue   Lisinopril     Fatigue    Current meds: aspirin 325 mg tablet,delayed release metoprolol succinate ER 25 mg tablet,extended release 24 hr  Review of Systems  Constitutional: Negative.   HENT: Negative.    Eyes: Negative.   Respiratory: Negative.    Cardiovascular: Negative.   Endocrine: Negative.   Genitourinary: Negative.   Musculoskeletal:  Positive for arthralgias, gait problem and joint swelling.  Skin: Negative.   Allergic/Immunologic: Negative.   Hematological: Negative.   Psychiatric/Behavioral: Negative.      There were no vitals taken for this visit. Physical Exam Constitutional:      Appearance: Normal appearance.  HENT:     Head: Normocephalic and atraumatic.     Right Ear: External ear normal.     Left Ear: External ear normal.     Nose: Nose normal.     Mouth/Throat:     Pharynx: Oropharynx is clear.  Eyes:     Conjunctiva/sclera: Conjunctivae normal.  Cardiovascular:     Rate and Rhythm: Normal rate and regular rhythm.     Pulses: Normal pulses.  Pulmonary:     Effort: Pulmonary effort is normal.  Abdominal:     General: Bowel sounds are normal.  Musculoskeletal:     Cervical back: Normal range of motion and neck supple.  Comments: Left knee tender medial joint line. ROM 0-120. No calf pain or sign of DVT.  Neurological:     Mental Status: He is alert.    Prior standing x-rays reviewed with bone-on-bone end-stage medial joint space narrowing with a varus deformity left knee. Prosthesis on the right status post total knee replacement in excellent alignment with no signs of osteolysis or loosening.  Assessment/Plan Impression: End-stage left knee osteoarthritis  Plan: Pt with end-stage left knee  DJD, bone-on-bone, refractory to conservative tx, scheduled for left total knee replacement by Dr. Tonita Andrews on October 6. We again discussed the procedure itself as well as risks, complications and alternatives, including but not limited to DVT, PE, infx, bleeding, failure of procedure, need for secondary procedure including manipulation, nerve injury, ongoing pain/symptoms, anesthesia risk, even stroke or death. Also discussed typical post-op protocols, activity restrictions, need for PT, flexion/extension exercises, time out of work. Discussed need for DVT ppx post-op per protocol. Discussed dental ppx and infx prevention. Also discussed limitations post-operatively such as kneeling and squatting. All questions were answered. Patient desires to proceed with surgery as scheduled.  Will hold supplements, ASA and NSAIDs accordingly. Will remain NPO after midnight the night before surgery. Will present to [facility] for pre-op testing. Anticipate hospital stay to include at least 2 midnights given medical history and to ensure proper pain control. Plan aspirin for DVT ppx post-op. Plan oxycodone, Colace, Miralax. Plan home with HHPT post-op with family members at home for assistance then transition to outpatient PT at approximately 2 weeks postop. Will follow up 10-14 days post-op for staple removal and xrays.  Plan Left total knee replacement  Danny Kicks, PA-C for Dr. Tonita Andrews 08/14/2022, 12:54 PM

## 2022-08-14 NOTE — H&P (Signed)
Danny Andrews is an 71 y.o. male.   Chief Complaint: left knee pain HPI: Patient is here for his H&P. Patient is scheduled for a left total knee replacement by Dr. Shelle Iron on 08/16/22 at [facility].  Dr. Shelle Iron and the patient mutually agreed to proceed with a total knee replacement. Risks and benefits of the procedure were discussed including stiffness, suboptimal range of motion, persistent pain, infection requiring removal of prosthesis and reinsertion, need for prophylactic antibiotics in the future, for example, dental procedures, possible need for manipulation, revision in the future and also anesthetic complications including DVT, PE, etc. We discussed the perioperative course, time in the hospital, postoperative recovery and the need for elevation to control swelling. We also discussed the predicted range of motion and the probability that squatting and kneeling would be unobtainable in the future. In addition, postoperative anticoagulation was discussed. We have obtained preoperative medical clearance as necessary. Provided illustrated handout and discussed it in detail. They will enroll in the total joint replacement educational forum at the hospital.  He was cleared by his PCP. He has been holding aspirin for several weeks. He has all his equipment from when we replaced his right knee. Did not have any issues with postop nausea and vomiting previously.  Past Medical History:  Diagnosis Date   Arthritis    Dysrhythmia    Headache    Hypertension    Sleep apnea     Past Surgical History:  Procedure Laterality Date   EYE SURGERY Bilateral 2013   Cataracts   TOTAL KNEE ARTHROPLASTY Right 08/27/2017   Procedure: RIGHT TOTAL KNEE ARTHROPLASTY;  Surgeon: Jene Every, MD;  Location: [facility];  Service: Orthopedics;  Laterality: Right;  Adductor Block   VASECTOMY  1990    No family history on file. Social History:  reports that he quit smoking about 53 years ago. His smoking use  included cigarettes. He has quit using smokeless tobacco. He reports current alcohol use of about 2.0 standard drinks of alcohol per week. He reports that he does not use drugs.  Allergies:  Allergies  Allergen Reactions   Atorvastatin     fatigue   Lisinopril     Fatigue    Current meds: aspirin 325 mg tablet,delayed release metoprolol succinate ER 25 mg tablet,extended release 24 hr  Review of Systems  Constitutional: Negative.   HENT: Negative.    Eyes: Negative.   Respiratory: Negative.    Cardiovascular: Negative.   Endocrine: Negative.   Genitourinary: Negative.   Musculoskeletal:  Positive for arthralgias, gait problem and joint swelling.  Skin: Negative.   Allergic/Immunologic: Negative.   Hematological: Negative.   Psychiatric/Behavioral: Negative.      There were no vitals taken for this visit. Physical Exam Constitutional:      Appearance: Normal appearance.  HENT:     Head: Normocephalic and atraumatic.     Right Ear: External ear normal.     Left Ear: External ear normal.     Nose: Nose normal.     Mouth/Throat:     Pharynx: Oropharynx is clear.  Eyes:     Conjunctiva/sclera: Conjunctivae normal.  Cardiovascular:     Rate and Rhythm: Normal rate and regular rhythm.     Pulses: Normal pulses.  Pulmonary:     Effort: Pulmonary effort is normal.  Abdominal:     General: Bowel sounds are normal.  Musculoskeletal:     Cervical back: Normal range of motion and neck supple.  Comments: Left knee tender medial joint line. ROM 0-120. No calf pain or sign of DVT.  Neurological:     Mental Status: He is alert.    Prior standing x-rays reviewed with bone-on-bone end-stage medial joint space narrowing with a varus deformity left knee. Prosthesis on the right status post total knee replacement in excellent alignment with no signs of osteolysis or loosening.  Assessment/Plan Impression: End-stage left knee osteoarthritis  Plan: Pt with end-stage left knee  DJD, bone-on-bone, refractory to conservative tx, scheduled for left total knee replacement by Dr. Tonita Cong on October 6. We again discussed the procedure itself as well as risks, complications and alternatives, including but not limited to DVT, PE, infx, bleeding, failure of procedure, need for secondary procedure including manipulation, nerve injury, ongoing pain/symptoms, anesthesia risk, even stroke or death. Also discussed typical post-op protocols, activity restrictions, need for PT, flexion/extension exercises, time out of work. Discussed need for DVT ppx post-op per protocol. Discussed dental ppx and infx prevention. Also discussed limitations post-operatively such as kneeling and squatting. All questions were answered. Patient desires to proceed with surgery as scheduled.  Will hold supplements, ASA and NSAIDs accordingly. Will remain NPO after midnight the night before surgery. Will present to [facility] for pre-op testing. Anticipate hospital stay to include at least 2 midnights given medical history and to ensure proper pain control. Plan aspirin for DVT ppx post-op. Plan oxycodone, Colace, Miralax. Plan home with HHPT post-op with family members at home for assistance then transition to outpatient PT at approximately 2 weeks postop. Will follow up 10-14 days post-op for staple removal and xrays.  Plan Left total knee replacement  Cecilie Kicks, PA-C for Dr. Tonita Cong 08/14/2022, 12:54 PM

## 2022-08-16 ENCOUNTER — Other Ambulatory Visit: Payer: Self-pay

## 2022-08-16 ENCOUNTER — Encounter (HOSPITAL_COMMUNITY): Payer: Self-pay | Admitting: Specialist

## 2022-08-16 ENCOUNTER — Ambulatory Visit (HOSPITAL_COMMUNITY): Payer: No Typology Code available for payment source | Admitting: Physician Assistant

## 2022-08-16 ENCOUNTER — Ambulatory Visit (HOSPITAL_COMMUNITY)
Admission: RE | Admit: 2022-08-16 | Discharge: 2022-08-17 | Disposition: A | Discharge status: Home / Self Care | Payer: No Typology Code available for payment source | Attending: Specialist | Admitting: Specialist

## 2022-08-16 ENCOUNTER — Encounter (HOSPITAL_COMMUNITY)
Admission: RE | Disposition: A | Discharge status: Home / Self Care | Payer: Self-pay | Source: Home / Self Care | Attending: Specialist

## 2022-08-16 ENCOUNTER — Ambulatory Visit (HOSPITAL_BASED_OUTPATIENT_CLINIC_OR_DEPARTMENT_OTHER): Payer: No Typology Code available for payment source | Admitting: Certified Registered"

## 2022-08-16 ENCOUNTER — Ambulatory Visit (HOSPITAL_COMMUNITY): Payer: No Typology Code available for payment source

## 2022-08-16 DIAGNOSIS — Z9889 Other specified postprocedural states: Secondary | ICD-10-CM

## 2022-08-16 DIAGNOSIS — G4730 Sleep apnea, unspecified: Secondary | ICD-10-CM | POA: Insufficient documentation

## 2022-08-16 DIAGNOSIS — M1712 Unilateral primary osteoarthritis, left knee: Secondary | ICD-10-CM | POA: Diagnosis present

## 2022-08-16 DIAGNOSIS — Z87891 Personal history of nicotine dependence: Secondary | ICD-10-CM | POA: Insufficient documentation

## 2022-08-16 DIAGNOSIS — M21162 Varus deformity, not elsewhere classified, left knee: Secondary | ICD-10-CM | POA: Diagnosis not present

## 2022-08-16 DIAGNOSIS — I10 Essential (primary) hypertension: Secondary | ICD-10-CM | POA: Insufficient documentation

## 2022-08-16 HISTORY — PX: TOTAL KNEE ARTHROPLASTY: SHX125

## 2022-08-16 SURGERY — ARTHROPLASTY, KNEE, TOTAL
Anesthesia: Spinal | Site: Knee | Laterality: Left

## 2022-08-16 MED ORDER — STERILE WATER FOR IRRIGATION IR SOLN
Status: DC | PRN
  Administered 2022-08-16: 2000 mL

## 2022-08-16 MED ORDER — LIDOCAINE 2% (20 MG/ML) 5 ML SYRINGE
INTRAMUSCULAR | Status: DC | PRN
  Administered 2022-08-16: 40 mg via INTRAVENOUS

## 2022-08-16 MED ORDER — OXYCODONE HCL 5 MG PO TABS
10.0000 mg | ORAL_TABLET | ORAL | Status: DC | PRN
  Administered 2022-08-16 – 2022-08-17 (×3): 10 mg via ORAL
  Filled 2022-08-16 (×3): qty 2

## 2022-08-16 MED ORDER — METOCLOPRAMIDE HCL 5 MG PO TABS: 5.0000 mg | ORAL_TABLET | Freq: Three times a day (TID) | ORAL | Status: DC | PRN

## 2022-08-16 MED ORDER — METHOCARBAMOL 500 MG IVPB - SIMPLE MED: 500.0000 mg | Freq: Four times a day (QID) | INTRAVENOUS | Status: DC | PRN

## 2022-08-16 MED ORDER — CHLORHEXIDINE GLUCONATE 0.12 % MT SOLN
15.0000 mL | Freq: Once | OROMUCOSAL | Status: AC
  Administered 2022-08-16: 15 mL via OROMUCOSAL

## 2022-08-16 MED ORDER — ASPIRIN 81 MG PO TBEC: 81.0000 mg | DELAYED_RELEASE_TABLET | Freq: Two times a day (BID) | ORAL | 1 refills | Status: DC

## 2022-08-16 MED ORDER — MIDAZOLAM HCL 2 MG/2ML IJ SOLN
1.0000 mg | INTRAMUSCULAR | Status: DC
  Filled 2022-08-16: qty 2

## 2022-08-16 MED ORDER — ACETAMINOPHEN 10 MG/ML IV SOLN
1000.0000 mg | INTRAVENOUS | Status: AC
  Administered 2022-08-16: 1000 mg via INTRAVENOUS
  Filled 2022-08-16: qty 100

## 2022-08-16 MED ORDER — BISACODYL 5 MG PO TBEC: 5.0000 mg | DELAYED_RELEASE_TABLET | Freq: Every day | ORAL | Status: DC | PRN

## 2022-08-16 MED ORDER — BUPIVACAINE-EPINEPHRINE 0.25% -1:200000 IJ SOLN
INTRAMUSCULAR | Status: DC | PRN
  Administered 2022-08-16: 19 mL

## 2022-08-16 MED ORDER — METHOCARBAMOL 500 MG PO TABS
500.0000 mg | ORAL_TABLET | Freq: Four times a day (QID) | ORAL | Status: DC | PRN
  Administered 2022-08-16 – 2022-08-17 (×2): 500 mg via ORAL
  Filled 2022-08-16 (×2): qty 1

## 2022-08-16 MED ORDER — CEFAZOLIN SODIUM-DEXTROSE 2-4 GM/100ML-% IV SOLN
2.0000 g | INTRAVENOUS | Status: AC
  Administered 2022-08-16: 2 g via INTRAVENOUS
  Filled 2022-08-16: qty 100

## 2022-08-16 MED ORDER — TRANEXAMIC ACID-NACL 1000-0.7 MG/100ML-% IV SOLN
1000.0000 mg | INTRAVENOUS | Status: AC
  Administered 2022-08-16: 1000 mg via INTRAVENOUS
  Filled 2022-08-16: qty 100

## 2022-08-16 MED ORDER — ASPIRIN 81 MG PO CHEW
81.0000 mg | CHEWABLE_TABLET | Freq: Two times a day (BID) | ORAL | Status: DC
  Administered 2022-08-17: 81 mg via ORAL
  Filled 2022-08-16: qty 1

## 2022-08-16 MED ORDER — DOCUSATE SODIUM 100 MG PO CAPS
100.0000 mg | ORAL_CAPSULE | Freq: Two times a day (BID) | ORAL | Status: DC
  Administered 2022-08-16 – 2022-08-17 (×2): 100 mg via ORAL
  Filled 2022-08-16 (×2): qty 1

## 2022-08-16 MED ORDER — ONDANSETRON HCL 4 MG/2ML IJ SOLN: 4.0000 mg | Freq: Once | INTRAMUSCULAR | Status: DC | PRN

## 2022-08-16 MED ORDER — DEXAMETHASONE SODIUM PHOSPHATE 10 MG/ML IJ SOLN
INTRAMUSCULAR | Status: DC | PRN
  Administered 2022-08-16: 10 mg via INTRAVENOUS

## 2022-08-16 MED ORDER — SODIUM CHLORIDE (PF) 0.9 % IJ SOLN
INTRAMUSCULAR | Status: AC
  Filled 2022-08-16: qty 50

## 2022-08-16 MED ORDER — OXYCODONE HCL 5 MG PO TABS
5.0000 mg | ORAL_TABLET | ORAL | Status: DC | PRN
  Filled 2022-08-16: qty 2

## 2022-08-16 MED ORDER — FENTANYL CITRATE PF 50 MCG/ML IJ SOSY: 25.0000 ug | PREFILLED_SYRINGE | INTRAMUSCULAR | Status: DC | PRN

## 2022-08-16 MED ORDER — MENTHOL 3 MG MT LOZG: 1.0000 | LOZENGE | OROMUCOSAL | Status: DC | PRN

## 2022-08-16 MED ORDER — ROPIVACAINE HCL 7.5 MG/ML IJ SOLN
INTRAMUSCULAR | Status: DC | PRN
  Administered 2022-08-16: 20 mL via PERINEURAL

## 2022-08-16 MED ORDER — BUPIVACAINE LIPOSOME 1.3 % IJ SUSP
INTRAMUSCULAR | Status: AC
  Filled 2022-08-16: qty 20

## 2022-08-16 MED ORDER — OXYCODONE HCL 5 MG PO TABS: 5.0000 mg | ORAL_TABLET | Freq: Once | ORAL | Status: DC | PRN

## 2022-08-16 MED ORDER — PHENOL 1.4 % MT LIQD: 1.0000 | OROMUCOSAL | Status: DC | PRN

## 2022-08-16 MED ORDER — POLYETHYLENE GLYCOL 3350 17 G PO PACK: 17.0000 g | PACK | Freq: Every day | ORAL | Status: DC | PRN

## 2022-08-16 MED ORDER — ALUM & MAG HYDROXIDE-SIMETH 200-200-20 MG/5ML PO SUSP: 30.0000 mL | ORAL | Status: DC | PRN

## 2022-08-16 MED ORDER — POLYVINYL ALCOHOL 1.4 % OP SOLN: 1.0000 [drp] | OPHTHALMIC | Status: DC | PRN

## 2022-08-16 MED ORDER — MAGNESIUM CITRATE PO SOLN: 1.0000 | Freq: Once | ORAL | Status: DC | PRN

## 2022-08-16 MED ORDER — PROPOFOL 10 MG/ML IV BOLUS
INTRAVENOUS | Status: DC | PRN
  Administered 2022-08-16 (×2): 20 mg via INTRAVENOUS
  Administered 2022-08-16: 40 mg via INTRAVENOUS

## 2022-08-16 MED ORDER — LOSARTAN POTASSIUM-HCTZ 50-12.5 MG PO TABS: 1.0000 | ORAL_TABLET | Freq: Every day | ORAL | Status: DC

## 2022-08-16 MED ORDER — DOCUSATE SODIUM 100 MG PO CAPS: 100.0000 mg | ORAL_CAPSULE | Freq: Two times a day (BID) | ORAL | 1 refills | Status: DC | PRN

## 2022-08-16 MED ORDER — PROPOFOL 500 MG/50ML IV EMUL
INTRAVENOUS | Status: DC | PRN
  Administered 2022-08-16: 100 ug/kg/min via INTRAVENOUS

## 2022-08-16 MED ORDER — ACETAMINOPHEN 500 MG PO TABS: 1000.0000 mg | ORAL_TABLET | Freq: Once | ORAL | Status: DC

## 2022-08-16 MED ORDER — LACTATED RINGERS IV SOLN: INTRAVENOUS | Status: DC

## 2022-08-16 MED ORDER — ACETAMINOPHEN 325 MG PO TABS: 325.0000 mg | ORAL_TABLET | Freq: Four times a day (QID) | ORAL | Status: DC | PRN

## 2022-08-16 MED ORDER — BUPIVACAINE IN DEXTROSE 0.75-8.25 % IT SOLN
INTRATHECAL | Status: DC | PRN
  Administered 2022-08-16: 1.8 mL via INTRATHECAL

## 2022-08-16 MED ORDER — PROPOFOL 1000 MG/100ML IV EMUL
INTRAVENOUS | Status: AC
  Filled 2022-08-16: qty 100

## 2022-08-16 MED ORDER — BUPIVACAINE LIPOSOME 1.3 % IJ SUSP
INTRAMUSCULAR | Status: DC | PRN
  Administered 2022-08-16: 20 mL

## 2022-08-16 MED ORDER — BUPIVACAINE-EPINEPHRINE (PF) 0.25% -1:200000 IJ SOLN
INTRAMUSCULAR | Status: AC
  Filled 2022-08-16: qty 30

## 2022-08-16 MED ORDER — ONDANSETRON HCL 4 MG PO TABS: 4.0000 mg | ORAL_TABLET | Freq: Four times a day (QID) | ORAL | Status: DC | PRN

## 2022-08-16 MED ORDER — DIPHENHYDRAMINE HCL 12.5 MG/5ML PO ELIX: 12.5000 mg | ORAL_SOLUTION | ORAL | Status: DC | PRN

## 2022-08-16 MED ORDER — HYDROMORPHONE HCL 1 MG/ML IJ SOLN: 0.5000 mg | INTRAMUSCULAR | Status: DC | PRN

## 2022-08-16 MED ORDER — SODIUM CHLORIDE 0.9% FLUSH
INTRAVENOUS | Status: DC | PRN
  Administered 2022-08-16: 40 mL via INTRAVENOUS

## 2022-08-16 MED ORDER — KCL IN DEXTROSE-NACL 20-5-0.45 MEQ/L-%-% IV SOLN
INTRAVENOUS | Status: DC
  Filled 2022-08-16: qty 1000

## 2022-08-16 MED ORDER — OXYCODONE HCL 5 MG PO TABS: 5.0000 mg | ORAL_TABLET | ORAL | 0 refills | Status: DC | PRN

## 2022-08-16 MED ORDER — SODIUM CHLORIDE 0.9 % IR SOLN
Status: DC | PRN
  Administered 2022-08-16: 1000 mL

## 2022-08-16 MED ORDER — PHENYLEPHRINE HCL-NACL 20-0.9 MG/250ML-% IV SOLN
INTRAVENOUS | Status: DC | PRN
  Administered 2022-08-16: 20 ug/min via INTRAVENOUS

## 2022-08-16 MED ORDER — CEFAZOLIN SODIUM-DEXTROSE 2-4 GM/100ML-% IV SOLN
2.0000 g | Freq: Four times a day (QID) | INTRAVENOUS | Status: AC
  Administered 2022-08-16 – 2022-08-17 (×2): 2 g via INTRAVENOUS
  Filled 2022-08-16 (×2): qty 100

## 2022-08-16 MED ORDER — RISAQUAD PO CAPS
1.0000 | ORAL_CAPSULE | Freq: Every day | ORAL | Status: DC
  Administered 2022-08-17: 1 via ORAL
  Filled 2022-08-16: qty 1

## 2022-08-16 MED ORDER — LOSARTAN POTASSIUM 50 MG PO TABS
50.0000 mg | ORAL_TABLET | Freq: Every day | ORAL | Status: DC
  Administered 2022-08-17: 50 mg via ORAL
  Filled 2022-08-16: qty 1

## 2022-08-16 MED ORDER — ONDANSETRON HCL 4 MG/2ML IJ SOLN: 4.0000 mg | Freq: Four times a day (QID) | INTRAMUSCULAR | Status: DC | PRN

## 2022-08-16 MED ORDER — ONDANSETRON HCL 4 MG/2ML IJ SOLN
INTRAMUSCULAR | Status: DC | PRN
  Administered 2022-08-16: 4 mg via INTRAVENOUS

## 2022-08-16 MED ORDER — METOCLOPRAMIDE HCL 5 MG/ML IJ SOLN: 5.0000 mg | Freq: Three times a day (TID) | INTRAMUSCULAR | Status: DC | PRN

## 2022-08-16 MED ORDER — HYDROCHLOROTHIAZIDE 12.5 MG PO TABS
12.5000 mg | ORAL_TABLET | Freq: Every day | ORAL | Status: DC
  Administered 2022-08-17: 12.5 mg via ORAL
  Filled 2022-08-16: qty 1

## 2022-08-16 MED ORDER — CARBOXYMETHYLCELLULOSE SODIUM 0.25 % OP SOLN: Freq: Every day | OPHTHALMIC | Status: DC | PRN

## 2022-08-16 MED ORDER — ACETAMINOPHEN 500 MG PO TABS
1000.0000 mg | ORAL_TABLET | Freq: Four times a day (QID) | ORAL | Status: DC
  Administered 2022-08-16 – 2022-08-17 (×3): 1000 mg via ORAL
  Filled 2022-08-16 (×3): qty 2

## 2022-08-16 MED ORDER — 0.9 % SODIUM CHLORIDE (POUR BTL) OPTIME
TOPICAL | Status: DC | PRN
  Administered 2022-08-16: 1000 mL

## 2022-08-16 MED ORDER — OXYCODONE HCL 5 MG/5ML PO SOLN: 5.0000 mg | Freq: Once | ORAL | Status: DC | PRN

## 2022-08-16 MED ORDER — ORAL CARE MOUTH RINSE: 15.0000 mL | Freq: Once | OROMUCOSAL | Status: AC

## 2022-08-16 MED ORDER — FENTANYL CITRATE PF 50 MCG/ML IJ SOSY
50.0000 ug | PREFILLED_SYRINGE | INTRAMUSCULAR | Status: DC
  Administered 2022-08-16: 50 ug via INTRAVENOUS
  Filled 2022-08-16: qty 2

## 2022-08-16 MED ORDER — POLYETHYLENE GLYCOL 3350 17 G PO PACK: 17.0000 g | PACK | Freq: Every day | ORAL | 0 refills | Status: DC

## 2022-08-16 SURGICAL SUPPLY — 66 items
ATTUNE MED DOME PAT 38 KNEE (Knees) IMPLANT
ATTUNE PS FEM LT SZ 8 CEM KNEE (Femur) IMPLANT
ATTUNE PSRP INSR SZ8 6 KNEE (Insert) IMPLANT
BAG COUNTER SPONGE SURGICOUNT (BAG) IMPLANT
BAG DECANTER FOR FLEXI CONT (MISCELLANEOUS) ×1 IMPLANT
BAG ZIPLOCK 12X15 (MISCELLANEOUS) IMPLANT
BASE TIBIAL ROT PLAT SZ 8 KNEE (Knees) IMPLANT
BLADE SAW SGTL 11.0X1.19X90.0M (BLADE) ×1 IMPLANT
BLADE SAW SGTL 13.0X1.19X90.0M (BLADE) IMPLANT
BLADE SURG SZ10 CARB STEEL (BLADE) ×2 IMPLANT
BNDG ELASTIC 4X5.8 VLCR STR LF (GAUZE/BANDAGES/DRESSINGS) ×1 IMPLANT
BNDG ELASTIC 6X5.8 VLCR STR LF (GAUZE/BANDAGES/DRESSINGS) ×1 IMPLANT
BOWL SMART MIX CTS (DISPOSABLE) ×1 IMPLANT
CEMENT HV SMART SET (Cement) ×2 IMPLANT
COVER SURGICAL LIGHT HANDLE (MISCELLANEOUS) ×1 IMPLANT
CUFF TOURN SGL QUICK 34 (TOURNIQUET CUFF) ×1
CUFF TRNQT CYL 34X4.125X (TOURNIQUET CUFF) ×1 IMPLANT
DRAPE INCISE IOBAN 66X45 STRL (DRAPES) IMPLANT
DRAPE ORTHO SPLIT 77X108 STRL (DRAPES) ×2
DRAPE SHEET LG 3/4 BI-LAMINATE (DRAPES) ×1 IMPLANT
DRAPE SURG ORHT 6 SPLT 77X108 (DRAPES) ×2 IMPLANT
DRAPE U-SHAPE 47X51 STRL (DRAPES) ×1 IMPLANT
DRSG AQUACEL AG ADV 3.5X10 (GAUZE/BANDAGES/DRESSINGS) ×1 IMPLANT
DRSG TEGADERM 4X4.75 (GAUZE/BANDAGES/DRESSINGS) IMPLANT
DURAPREP 26ML APPLICATOR (WOUND CARE) ×1 IMPLANT
ELECT BLADE TIP CTD 4 INCH (ELECTRODE) IMPLANT
ELECT REM PT RETURN 15FT ADLT (MISCELLANEOUS) ×1 IMPLANT
EVACUATOR 1/8 PVC DRAIN (DRAIN) IMPLANT
GAUZE SPONGE 2X2 8PLY STRL LF (GAUZE/BANDAGES/DRESSINGS) IMPLANT
GLOVE BIO SURGEON STRL SZ7 (GLOVE) ×1 IMPLANT
GLOVE BIOGEL PI IND STRL 7.0 (GLOVE) ×1 IMPLANT
GLOVE BIOGEL PI IND STRL 8 (GLOVE) ×1 IMPLANT
GLOVE SURG SS PI 8.0 STRL IVOR (GLOVE) ×1 IMPLANT
GOWN STRL REUS W/ TWL XL LVL3 (GOWN DISPOSABLE) ×2 IMPLANT
GOWN STRL REUS W/TWL XL LVL3 (GOWN DISPOSABLE) ×2
HANDPIECE INTERPULSE COAX TIP (DISPOSABLE) ×1
HEMOSTAT SPONGE AVITENE ULTRA (HEMOSTASIS) IMPLANT
HOLDER FOLEY CATH W/STRAP (MISCELLANEOUS) IMPLANT
IMMOBILIZER KNEE 20 (SOFTGOODS) ×1
IMMOBILIZER KNEE 20 THIGH 36 (SOFTGOODS) ×1 IMPLANT
KIT TURNOVER KIT A (KITS) IMPLANT
MANIFOLD NEPTUNE II (INSTRUMENTS) ×1 IMPLANT
NS IRRIG 1000ML POUR BTL (IV SOLUTION) IMPLANT
PACK TOTAL KNEE CUSTOM (KITS) ×1 IMPLANT
PROTECTOR NERVE ULNAR (MISCELLANEOUS) ×1 IMPLANT
SAW OSC TIP CART 19.5X105X1.3 (SAW) ×1 IMPLANT
SEALER BIPOLAR AQUA 6.0 (INSTRUMENTS) IMPLANT
SET HNDPC FAN SPRY TIP SCT (DISPOSABLE) ×1 IMPLANT
SOLUTION PRONTOSAN WOUND 350ML (IRRIGATION / IRRIGATOR) ×1 IMPLANT
SPIKE FLUID TRANSFER (MISCELLANEOUS) ×1 IMPLANT
STAPLER VISISTAT (STAPLE) IMPLANT
STRIP CLOSURE SKIN 1/2X4 (GAUZE/BANDAGES/DRESSINGS) IMPLANT
SUT BONE WAX W31G (SUTURE) ×1 IMPLANT
SUT MNCRL AB 4-0 PS2 18 (SUTURE) IMPLANT
SUT STRATAFIX 0 PDS 27 VIOLET (SUTURE) ×1
SUT VIC AB 1 CT1 27 (SUTURE) ×3
SUT VIC AB 1 CT1 27XBRD ANTBC (SUTURE) ×3 IMPLANT
SUT VIC AB 2-0 CT1 27 (SUTURE) ×3
SUT VIC AB 2-0 CT1 TAPERPNT 27 (SUTURE) ×3 IMPLANT
SUTURE STRATFX 0 PDS 27 VIOLET (SUTURE) ×1 IMPLANT
SYR 3ML LL SCALE MARK (SYRINGE) IMPLANT
TIBIAL BASE ROT PLAT SZ 8 KNEE (Knees) ×1 IMPLANT
TRAY FOLEY MTR SLVR 16FR STAT (SET/KITS/TRAYS/PACK) ×1 IMPLANT
WATER STERILE IRR 1000ML POUR (IV SOLUTION) ×1 IMPLANT
WIPE CHG 2% 2PK PREOPERATIVE (MISCELLANEOUS) ×1 IMPLANT
WRAP KNEE MAXI GEL POST OP (GAUZE/BANDAGES/DRESSINGS) ×1 IMPLANT

## 2022-08-16 NOTE — Interval H&P Note (Signed)
History and Physical Interval Note:  08/16/2022 11:54 AM  Danny Andrews  has presented today for surgery, with the diagnosis of Left knee degenerative joint disease.  The various methods of treatment have been discussed with the patient and family. After consideration of risks, benefits and other options for treatment, the patient has consented to  Procedure(s): TOTAL KNEE ARTHROPLASTY (Left) as a surgical intervention.  The patient's history has been reviewed, patient examined, no change in status, stable for surgery.  I have reviewed the patient's chart and labs.  Questions were answered to the patient's satisfaction.     Johnn Hai

## 2022-08-16 NOTE — Op Note (Signed)
[name], [name] MEDICAL RECORD NO: [id number] ACCOUNT NO: [id number] DATE OF BIRTH: August 13, 1951 FACILITY: [facility]ATION: [id number] PHYSICIAN: Javier Docker, MD  Operative Report   DATE OF PROCEDURE: 08/16/2022  SURGEON:  Javier Docker, MD  PREOPERATIVE DIAGNOSES:  End-stage osteoarthrosis; varus deformity, left knee.  POSTOPERATIVE DIAGNOSES:  End-stage osteoarthrosis; varus deformity, left knee.  PROCEDURE PERFORMED:  Left total knee arthroplasty utilizing DePuy Attune rotating platform 7 femur, 7 tibia, 6 mm insert, 38 patella.  ANESTHESIA:  Spinal.  ASSISTANT:  Andrez Grime, PA.  HISTORY:  A 71 with end-stage osteoarthrosis, varus deformity, left knee, bone-on-bone, indicated for replacement of the degenerated joint.  Risks and benefits discussed including bleeding, infection, damage to neurovascular structures, no change in  symptoms, worsening symptoms, DVT, PE, anesthetic complications, etc.  DESCRIPTION OF PROCEDURE:  With the patient in supine position after induction of adequate spinal anesthesia, 2 grams Kefzol, the left lower extremity was prepped and draped and exsanguinated in the usual sterile fashion.  Thigh tourniquet inflated to  225 mmHg.  Midline incision was then made over the knee.  Full thickness flaps developed.  Median parapatellar arthrotomy was performed.  Soft tissue elevated medially, preserving the MCL.  Patella was gently everted, knee was flexed.  Tricompartmental  osteoarthrosis was noted bone-on-bone medially.  Fat pad was debrided.  Remnants of medial and lateral menisci and the ACL were excised.  A Leksell was utilized to start a starting point above the femoral notch.  This was for the femoral drill following  entering the canal.  We irrigated the femoral canal.  I then used a T-handle, followed by intramedullary guide, 5-degree left, 10 off the distal femur.  This was then pinned.  I performed a distal femoral cut.  I then sized off the anterior  cortex to an  8.  This was in 3 degrees of external rotation.  This was pinned and I performed the anterior, posterior and chamfer cuts without notching of the anterior cortex.  Soft tissue protected at all times.  I subluxed the tibia.  I used the external alignment  guide throughout the defect, which was medial. External alignment guide bisected the tibiotalar joint 3-degree slope parallel to the shaft.  This was pinned.  I performed a proximal tibial cut.  I then tried an extension block 6, stable in full extension  and flexion, which were equivalent.  We then subluxed the tibia, measured it to an 8, maximizing her coverage just the medial aspect of tibial tubercle and this was pinned. I harvested bone centrally and then impacted into the distal femur.  I drilled  centrally, used our punch guide.  Attention turned back to the femur.  I used a box cut guide bisecting the canal.  We performed a box cut after appropriate pinning.  Following cutting the box cut, I placed a trial femur.  There was some catching of the  anterior cortex.  I then revised the anterior cut to incorporate some sclerotic bone more proximally.  This was without notching the femur.  I then reinserted the trial, it fit flush.  We drilled our lug holes.  I placed a 6 mm insert and reduced the  knee and had full extension, full flexion, good stability with varus and valgus stress at 0 and 30 degrees, negative anterior drawer.  Following this, I everted the patella.  Measured to a 25 planed to a 16 utilizing the patellar jig.  This was with an  oscillating saw.  It  was measured to a 38 with a trial paddle parallel to the joint surface.  Medializing the holes.  I drilled the holes and placed a 38 trial patella, reduced it, had excellent patellofemoral tracking.  I then removed all trials.   Checked posteriorly any remnants of the menisci were excised.  Cauterized the geniculates, popliteus and the capsule were intact.  We used pulsatile  lavage to clean the joint.  Mixed cement on back table under vacuum.  Flexed the knee, everted the  patella, thoroughly dried all surfaces.  I then placed cement in the tibial canal, digitally pressurizing it.  Cement was placed on the 8 tibial component, impacted into place with redundant cement removed.  I cemented and impacted the femur.  Redundant  cement removed.  Cement was on both the femoral component and the femur.  Next, I cemented and clamped the patella.  Placed Marcaine with epinephrine in the wound, held axial load with the knee in full extension throughout the curing of the cement.   Following this, I placed Prontosan in the wound as well as Marcaine with epinephrine and covered the wound during the curing of the cement.  After curing of the cement, the tourniquet was deflated at 65 minutes.  Any minimal bleeding was cauterized.  Had  full flexion, full extension and good stability.  I flexed the knee, everted the patella and meticulously removed all redundant cement.  I removed the trial.  I again meticulously removed all redundant cement.  I copiously irrigated with pulsatile  lavage followed by Prontosan wound irrigant.  Subluxed the tibia, selected a 6 mm insert, which I felt was optimal.  Reduced it and I had full extension, full flexion, good stability to varus valgus stressing at 0-30 degrees, negative anterior drawer.  I  then in slight flexion reapproximated patellar arthrotomy with #1 Vicryl in interrupted figure-of-eight sutures.  Prior to this, we used Exparel just in the  proximal aspect of the medial compartment through the medial capsule aspirating without  breaking the vacuum and injecting 20 mL posteriorly.  This was done prior to placement of the final insert.  Next, after reapproximated patellar arthrotomy with #1 Vicryl in interrupted figure-of-eight sutures, it was oversewn with a Stratafix.   Following this, I had excellent patellofemoral tracking and excellent stability  and full extension, full flexion, negative anterior drawer.  Copiously irrigated subcutaneous tissue, closed it with 2-0 Vicryl and the skin with staples.  Wound was dressed  sterilely and the patient was then placed in immobilizer and transported to the recovery room in satisfactory condition.  The patient tolerated the procedure well.  No complications.  Assistant, Lacie Draft, [place], was used throughout the case for patient positioning, exposure, closure.  BLOOD LOSS:  50 mL.   NIK D: 08/16/2022 2:46:28 pm T: 08/16/2022 10:30:00 pm  JOB: [id number]/ [id number]

## 2022-08-16 NOTE — Brief Op Note (Signed)
08/16/2022  11:59 AM  PATIENT:  Danny Andrews  71 y.o. male  PRE-OPERATIVE DIAGNOSIS:  Left knee degenerative joint disease  POST-OPERATIVE DIAGNOSIS:  * No post-op diagnosis entered *  PROCEDURE:  Procedure(s): TOTAL KNEE ARTHROPLASTY (Left)  SURGEON:  Surgeon(s) and Role:    Susa Day, MD - Primary  PHYSICIAN ASSISTANT:   ASSISTANTS: Bissell   ANESTHESIA:   spinal  EBL:  50   BLOOD ADMINISTERED:none  DRAINS: none   LOCAL MEDICATIONS USED:  MARCAINE     SPECIMEN:  No Specimen  DISPOSITION OF SPECIMEN:  N/A  COUNTS:  YES  TOURNIQUET:  * Missing tourniquet times found for documented tourniquets in log: [id number] *  DICTATION: .Other Dictation: Dictation Number [id number]  PLAN OF CARE: Admit for overnight observation  PATIENT DISPOSITION:  PACU - hemodynamically stable.   Delay start of Pharmacological VTE agent (>24hrs) due to surgical blood loss or risk of bleeding: no

## 2022-08-16 NOTE — Transfer of Care (Signed)
Immediate Anesthesia Transfer of Care Note  Patient: Danny Andrews  Procedure(s) Performed: TOTAL KNEE ARTHROPLASTY (Left: Knee)  Patient Location: PACU  Anesthesia Type:Spinal  Level of Consciousness: awake, alert  and oriented  Airway & Oxygen Therapy: Patient Spontanous Breathing and Patient connected to face mask oxygen  Post-op Assessment: Report given to RN and Post -op Vital signs reviewed and stable  Post vital signs: Reviewed and stable  Last Vitals:  Vitals Value Taken Time  BP 121/79 08/16/22 1447  Temp    Pulse 51 08/16/22 1449  Resp 13 08/16/22 1449  SpO2 100 % 08/16/22 1449  Vitals shown include unvalidated device data.  Last Pain:  Vitals:   08/16/22 1108  TempSrc:   PainSc: 0-No pain      Patients Stated Pain Goal: 3 (19/50/93 2671)  Complications: No notable events documented.

## 2022-08-16 NOTE — Discharge Instructions (Signed)

## 2022-08-16 NOTE — Anesthesia Procedure Notes (Signed)
Anesthesia Regional Block: Adductor canal block   Pre-Anesthetic Checklist: , timeout performed,  Correct Patient, Correct Site, Correct Laterality,  Correct Procedure, Correct Position, site marked,  Risks and benefits discussed,  Surgical consent,  Pre-op evaluation,  At surgeon's request and post-op pain management  Laterality: Left  Prep: chloraprep       Needles:  Injection technique: Single-shot  Needle Type: Echogenic Needle     Needle Length: 10cm  Needle Gauge: 21     Additional Needles:   Narrative:  Start time: 08/16/2022 11:03 AM End time: 08/16/2022 11:06 AM Injection made incrementally with aspirations every 5 mL.  Performed by: Personally  Anesthesiologist: Audry Pili, MD  Additional Notes: No pain on injection. No increased resistance to injection. Injection made in 5cc increments. Good needle visualization. Patient tolerated the procedure well.

## 2022-08-16 NOTE — Evaluation (Signed)
Physical Therapy Evaluation Patient Details Name: Danny Andrews MRN: [id number] DOB: September 02, 1951 Today's Date: 08/16/2022  History of Present Illness  Pt is a 71yo male presenting s/p L-TKA on 08/16/22. PMH: dysrhythmia, OSA, HTN, R-TKA 2018  Clinical Impression  Danny Andrews is a 71 y.o. male POD 0 s/p L-TKA. Patient reports IND with mobility at baseline. Patient is now limited by functional impairments (see PT problem list below) and requires supervision for bed mobility and min guard for transfers. Patient was able to ambulate 25 feet with RW and min guard level of assist. Patient instructed in exercise to facilitate ROM and circulation to manage edema. Provided incentive spirometer and with Vcs pt able to achieve . Patient will benefit from continued skilled PT interventions to address impairments and progress towards PLOF. Acute PT will follow to progress mobility and stair training in preparation for safe discharge home.       Recommendations for follow up therapy are one component of a multi-disciplinary discharge planning process, led by the attending physician.  Recommendations may be updated based on patient status, additional functional criteria and insurance authorization.  Follow Up Recommendations Follow physician's recommendations for discharge plan and follow up therapies      Assistance Recommended at Discharge Intermittent Supervision/Assistance  Patient can return home with the following  A little help with walking and/or transfers;A little help with bathing/dressing/bathroom;Assistance with cooking/housework;Assist for transportation;Help with stairs or ramp for entrance    Equipment Recommendations None recommended by PT  Recommendations for Other Services       Functional Status Assessment Patient has had a recent decline in their functional status and demonstrates the ability to make significant improvements in function in a reasonable and predictable amount of  time.     Precautions / Restrictions Precautions Precautions: Fall;Knee Precaution Booklet Issued: No Precaution Comments: no pillow Restrictions Weight Bearing Restrictions: No      Mobility  Bed Mobility Overal bed mobility: Needs Assistance Bed Mobility: Supine to Sit     Supine to sit: Supervision, HOB elevated     General bed mobility comments: for safety only    Transfers Overall transfer level: Needs assistance Equipment used: Rolling walker (2 wheels) Transfers: Sit to/from Stand Sit to Stand: Min guard, From elevated surface           General transfer comment: For safety only from elevated surface, no physical assist required, VCs for sequencing.    Ambulation/Gait Ambulation/Gait assistance: Min guard Gait Distance (Feet): 25 Feet Assistive device: Rolling walker (2 wheels) Gait Pattern/deviations: Step-to pattern Gait velocity: decreased     General Gait Details: Pt ambulated with RW and min guard no physical assist required or overt LOB noted, pt in L-KI.  Stairs            Wheelchair Mobility    Modified Rankin (Stroke Patients Only)       Balance Overall balance assessment: Needs assistance Sitting-balance support: Feet supported, No upper extremity supported Sitting balance-Leahy Scale: Good     Standing balance support: Reliant on assistive device for balance, During functional activity, Bilateral upper extremity supported Standing balance-Leahy Scale: Poor                               Pertinent Vitals/Pain Pain Assessment Pain Assessment: 0-10 Pain Score: 3  Pain Location: left knee Pain Descriptors / Indicators: Operative site guarding Pain Intervention(s): Limited activity within patient's tolerance, Monitored during session, Repositioned, Ice  applied    Home Living Family/patient expects to be discharged to:: Private residence Living Arrangements: Spouse/significant other Available Help at Discharge:  Family;Available 24 hours/day Type of Home: House Home Access: Stairs to enter Entrance Stairs-Rails: None Entrance Stairs-Number of Steps: 2 Alternate Level Stairs-Number of Steps: 12 Home Layout: Two level Home Equipment: Grab bars - tub/shower;Shower seat - built Arboriculturist (2 wheels) Additional Comments: Pt plans to stay downstairs for a few days prior to attempting to ascend stairs to 2nd floor    Prior Function Prior Level of Function : Independent/Modified Independent             Mobility Comments: IND ADLs Comments: IND     Hand Dominance   Dominant Hand: Left    Extremity/Trunk Assessment   Upper Extremity Assessment Upper Extremity Assessment: Overall WFL for tasks assessed    Lower Extremity Assessment Lower Extremity Assessment: RLE deficits/detail;LLE deficits/detail RLE Deficits / Details: MMT ank DF/PF 5/5 RLE Sensation: WNL LLE Deficits / Details: MMT ank DF/PF 5/5, No extensor lag noted LLE Sensation: WNL    Cervical / Trunk Assessment Cervical / Trunk Assessment: Normal  Communication   Communication: No difficulties  Cognition Arousal/Alertness: Awake/alert Behavior During Therapy: WFL for tasks assessed/performed Overall Cognitive Status: Within Functional Limits for tasks assessed                                          General Comments      Exercises Total Joint Exercises Ankle Circles/Pumps: AROM, Both, 10 reps   Assessment/Plan    PT Assessment Patient needs continued PT services  PT Problem List Decreased strength;Decreased range of motion;Decreased activity tolerance;Decreased balance;Decreased mobility;Pain       PT Treatment Interventions DME instruction;Gait training;Stair training;Functional mobility training;Therapeutic activities;Therapeutic exercise;Balance training;Neuromuscular re-education;Patient/family education    PT Goals (Current goals can be found in the Care Plan  section)  Acute Rehab PT Goals Patient Stated Goal: Walk without pain, play with granddaughter PT Goal Formulation: With patient Time For Goal Achievement: 08/23/22 Potential to Achieve Goals: Good    Frequency 7X/week     Co-evaluation               AM-PAC PT "6 Clicks" Mobility  Outcome Measure Help needed turning from your back to your side while in a flat bed without using bedrails?: None Help needed moving from lying on your back to sitting on the side of a flat bed without using bedrails?: A Little Help needed moving to and from a bed to a chair (including a wheelchair)?: A Little Help needed standing up from a chair using your arms (e.g., wheelchair or bedside chair)?: A Little Help needed to walk in hospital room?: A Little Help needed climbing 3-5 steps with a railing? : A Little 6 Click Score: 19    End of Session Equipment Utilized During Treatment: Gait belt;Left knee immobilizer Activity Tolerance: Patient tolerated treatment well;No increased pain Patient left: in chair;with call bell/phone within reach;with chair alarm set;with family/visitor present Nurse Communication: Mobility status PT Visit Diagnosis: Pain;Difficulty in walking, not elsewhere classified (R26.2) Pain - Right/Left: Left Pain - part of body: Knee    Time: 1753-1820 PT Time Calculation (min) (ACUTE ONLY): 27 min   Charges:   PT Evaluation $PT Eval Low Complexity: 1 Low PT Treatments $Gait Training: 8-22 mins        Jamesetta Geralds,  PT, DPT [facility] Office: [phone] Weekend pager: [phone]  Coolidge Breeze 08/16/2022, 6:29 PM

## 2022-08-16 NOTE — Anesthesia Procedure Notes (Signed)
Spinal  Patient location during procedure: OR Start time: 08/16/2022 12:21 PM End time: 08/16/2022 12:27 PM Reason for block: surgical anesthesia Staffing Performed: anesthesiologist  Anesthesiologist: Audry Pili, MD Performed by: Audry Pili, MD Authorized by: Audry Pili, MD   Preanesthetic Checklist Completed: patient identified, IV checked, risks and benefits discussed, surgical consent, monitors and equipment checked, pre-op evaluation and timeout performed Spinal Block Patient position: sitting Prep: DuraPrep Patient monitoring: heart rate, cardiac monitor, continuous pulse ox and blood pressure Approach: midline Location: L3-4 Injection technique: single-shot Needle Needle type: Pencan  Needle gauge: 24 G Assessment Events: second provider Additional Notes Consent was obtained prior to the procedure with all questions answered and concerns addressed. Risks including, but not limited to, bleeding, infection, nerve damage, paralysis, failed block, inadequate analgesia, allergic reaction, high spinal, itching, and headache were discussed and the patient wished to proceed. Functioning IV was confirmed and monitors were applied. Sterile prep and drape, including hand hygiene, mask, and sterile gloves were used. The patient was positioned and the spine was prepped. The skin was anesthetized with lidocaine. After failed attempt by CRNA, MD successful on first attempt. Free flow of clear CSF was obtained prior to injecting local anesthetic into the CSF. The spinal needle aspirated freely following injection. The needle was carefully withdrawn. The patient tolerated the procedure well.   Danny Don, MD

## 2022-08-16 NOTE — Anesthesia Preprocedure Evaluation (Addendum)
Anesthesia Evaluation  Patient identified by MRN, date of birth, ID band Patient awake    Reviewed: Allergy & Precautions, NPO status , Patient's Chart, lab work & pertinent test results  History of Anesthesia Complications Negative for: history of anesthetic complications  Airway Mallampati: I  TM Distance: <3 FB Neck ROM: Full    Dental  (+) Dental Advisory Given, Teeth Intact   Pulmonary sleep apnea and Continuous Positive Airway Pressure Ventilation , former smoker,    Pulmonary exam normal        Cardiovascular hypertension, Pt. on medications Normal cardiovascular exam     Neuro/Psych  Headaches, negative psych ROS   GI/Hepatic negative GI ROS, Neg liver ROS,   Endo/Other   Obesity   Renal/GU negative Renal ROS     Musculoskeletal  (+) Arthritis ,   Abdominal   Peds  Hematology negative hematology ROS (+)   Anesthesia Other Findings   Reproductive/Obstetrics                            Anesthesia Physical Anesthesia Plan  ASA: 2  Anesthesia Plan: Spinal   Post-op Pain Management: Regional block* and Tylenol PO (pre-op)*   Induction:   PONV Risk Score and Plan: 1 and Treatment may vary due to age or medical condition and Propofol infusion  Airway Management Planned: Natural Airway and Simple Face Mask  Additional Equipment: None  Intra-op Plan:   Post-operative Plan:   Informed Consent: I have reviewed the patients History and Physical, chart, labs and discussed the procedure including the risks, benefits and alternatives for the proposed anesthesia with the patient or authorized representative who has indicated his/her understanding and acceptance.       Plan Discussed with: CRNA and Anesthesiologist  Anesthesia Plan Comments: (Labs reviewed, platelets acceptable. Discussed risks and benefits of spinal, including spinal/epidural hematoma, infection, failed block,  and PDPH. Patient expressed understanding and wished to proceed. )       Anesthesia Quick Evaluation

## 2022-08-17 DIAGNOSIS — M1712 Unilateral primary osteoarthritis, left knee: Secondary | ICD-10-CM | POA: Diagnosis not present

## 2022-08-17 MED ORDER — OXYCODONE HCL 5 MG PO TABS: 5.0000 mg | ORAL_TABLET | ORAL | 0 refills | Status: AC | PRN

## 2022-08-17 MED ORDER — POLYETHYLENE GLYCOL 3350 17 G PO PACK: 17.0000 g | PACK | Freq: Every day | ORAL | 0 refills | Status: AC

## 2022-08-17 MED ORDER — ASPIRIN 81 MG PO TBEC: 81.0000 mg | DELAYED_RELEASE_TABLET | Freq: Two times a day (BID) | ORAL | 1 refills | Status: AC

## 2022-08-17 MED ORDER — DOCUSATE SODIUM 100 MG PO CAPS: 100.0000 mg | ORAL_CAPSULE | Freq: Two times a day (BID) | ORAL | 1 refills | Status: AC | PRN

## 2022-08-17 NOTE — TOC Transition Note (Signed)
Transition of Care [facility]) - CM/SW Discharge Note   Patient Details  Name: Danny Andrews MRN: [id number] Date of Birth: Jul 17, 1951  [facility] CM/SW Contact:  Henrietta Dine, RN Phone Number: 08/17/2022, 9:10 AM   Clinical Narrative:  pt from home with spouse, and will d/c back to same; HHPT was arranged for pt by MD's office; he is not sure of the name; pt has insurance and PCP; he has transportation from hospital and to his appts; no TOC needs.     Final next level of care: Home/Self Care Barriers to Discharge: No Barriers Identified   Patient Goals and CMS Choice        Discharge Placement                       Discharge Plan and Services   Discharge Planning Services: CM Consult                      HH Arranged: PT (HHPT arranged by office prior to surgery; pt not sure of name of company)          Social Determinants of Health (SDOH) Interventions Food Insecurity Interventions: Intervention Not Indicated Housing Interventions: Intervention Not Indicated Transportation Interventions: Intervention Not Indicated Utilities Interventions: Intervention Not Indicated   Readmission Risk Interventions     No data to display

## 2022-08-17 NOTE — Progress Notes (Signed)
Subjective: 1 Day Post-Op Procedure(s) (LRB): TOTAL KNEE ARTHROPLASTY (Left) Patient seen in rounds for Dr. Tonita Cong Patient reports pain as mild.   Has not voided yet today, urinary cath just removed prior to my arrival denies any flatus Has not worked with physical therapy at this morning  Objective: Vital signs in last 24 hours: Temp:  [97.5 F (36.4 C)-99 F (37.2 C)] 97.8 F (36.6 C) (10/07 0502) Pulse Rate:  [53-78] 63 (10/07 0502) Resp:  [9-20] 18 (10/07 0502) BP: (114-155)/(69-92) 131/88 (10/07 0502) SpO2:  [95 %-100 %] 98 % (10/07 0502) Weight:  [106.6 kg] 106.6 kg (10/06 1002)  Intake/Output from previous day: 10/06 0701 - 10/07 0700 In: 3107.1 [P.O.:840; I.V.:2141.2; IV Piggyback:125.9] Out: 1500 [Urine:1450; Blood:50] Intake/Output this shift: No intake/output data recorded.  No results for input(s): "HGB" in the last 72 hours. No results for input(s): "WBC", "RBC", "HCT", "PLT" in the last 72 hours. No results for input(s): "NA", "K", "CL", "CO2", "BUN", "CREATININE", "GLUCOSE", "CALCIUM" in the last 72 hours. No results for input(s): "LABPT", "INR" in the last 72 hours.  Neurologically intact Neurovascular intact Sensation intact distally Intact pulses distally Dorsiflexion/Plantar flexion intact Incision: dressing C/D/I Compartment soft   Assessment/Plan: 1 Day Post-Op Procedure(s) (LRB): TOTAL KNEE ARTHROPLASTY (Left) Up with therapy will be working on stair navigation this morning As long as he does well with therapies okay to be discharged We will follow-up in the office in 2 weeks Home health PT has been set up DVT prophylaxis will be aspirin   Nettie Elm [facility] [phone] 08/17/2022, 8:08 AM

## 2022-08-17 NOTE — Progress Notes (Signed)
Physical Therapy Treatment Patient Details Name: Danny Andrews MRN: [id number] DOB: [birth year] Today's Date: 08/17/2022   History of Present Illness Pt is a 71 yo male presenting s/p L-TKA on 08/16/22. PMH: dysrhythmia, OSA, HTN, R-TKA 2018    PT Comments    Pt ambulated in hallway, practiced safe stair technique, and performed LE exercises.  Pt provided with HEP and stair handouts.  Spouse arrived as therapist leaving room.  Pt had no further questions and feels ready for d/c home today.    Recommendations for follow up therapy are one component of a multi-disciplinary discharge planning process, led by the attending physician.  Recommendations may be updated based on patient status, additional functional criteria and insurance authorization.  Follow Up Recommendations  Follow physician's recommendations for discharge plan and follow up therapies     Assistance Recommended at Discharge Intermittent Supervision/Assistance  Patient can return home with the following A little help with walking and/or transfers;A little help with bathing/dressing/bathroom;Assistance with cooking/housework;Assist for transportation;Help with stairs or ramp for entrance   Equipment Recommendations  None recommended by PT    Recommendations for Other Services       Precautions / Restrictions Precautions Precautions: Fall;Knee Restrictions Weight Bearing Restrictions: No     Mobility  Bed Mobility               General bed mobility comments: pt in recliner    Transfers Overall transfer level: Needs assistance Equipment used: Rolling walker (2 wheels) Transfers: Sit to/from Stand Sit to Stand: Min guard, From elevated surface           General transfer comment: verbal cues for UE and LE positioning    Ambulation/Gait Ambulation/Gait assistance: Min guard Gait Distance (Feet): 160 Feet Assistive device: Rolling walker (2 wheels) Gait Pattern/deviations: Step-to pattern, Antalgic,  Decreased stance time - left       General Gait Details: verbal cues for sequence, RW positioning, no KI as pt able to perform SLR and no buckling observed   Stairs Stairs: Yes Stairs assistance: Min guard Stair Management: Step to pattern, Backwards, With walker Number of Stairs: 2 General stair comments: verbal cues for sequence and safety; pt aware spouse will hold RW for support/safety; pt performed twice and provided with stair technique handout   Wheelchair Mobility    Modified Rankin (Stroke Patients Only)       Balance                                            Cognition Arousal/Alertness: Awake/alert Behavior During Therapy: WFL for tasks assessed/performed Overall Cognitive Status: Within Functional Limits for tasks assessed                                          Exercises Total Joint Exercises Ankle Circles/Pumps: AROM, Both, 10 reps Quad Sets: AROM, Both, 10 reps Heel Slides: AAROM, Left, 10 reps Hip ABduction/ADduction: AROM, Left, 10 reps Straight Leg Raises: AROM, Left, 10 reps    General Comments        Pertinent Vitals/Pain Pain Assessment Pain Assessment: 0-10 Pain Score: 4  Pain Location: left knee Pain Descriptors / Indicators: Aching, Sore Pain Intervention(s): Repositioned, Monitored during session, Premedicated before session    Home Living  Prior Function            PT Goals (current goals can now be found in the care plan section) Progress towards PT goals: Progressing toward goals    Frequency    7X/week      PT Plan Current plan remains appropriate    Co-evaluation              AM-PAC PT "6 Clicks" Mobility   Outcome Measure  Help needed turning from your back to your side while in a flat bed without using bedrails?: None Help needed moving from lying on your back to sitting on the side of a flat bed without using bedrails?: A  Little Help needed moving to and from a bed to a chair (including a wheelchair)?: A Little Help needed standing up from a chair using your arms (e.g., wheelchair or bedside chair)?: A Little Help needed to walk in hospital room?: A Little Help needed climbing 3-5 steps with a railing? : A Little 6 Click Score: 19    End of Session Equipment Utilized During Treatment: Gait belt Activity Tolerance: Patient tolerated treatment well Patient left: in chair;with call bell/phone within reach;with chair alarm set;with family/visitor present Nurse Communication: Mobility status PT Visit Diagnosis: Difficulty in walking, not elsewhere classified (R26.2)     Time: 8250-0370 PT Time Calculation (min) (ACUTE ONLY): 16 min  Charges:  $Gait Training: 8-22 mins                    Thomasene Mohair PT, DPT Physical Therapist Acute Rehabilitation Services Preferred contact method: Secure Chat Weekend Pager Only: [phone] Office: [phone]    Danny Andrews 08/17/2022, 12:10 PM

## 2022-08-17 NOTE — Plan of Care (Signed)

## 2022-08-19 ENCOUNTER — Encounter (HOSPITAL_COMMUNITY): Payer: Self-pay | Admitting: Specialist

## 2022-08-19 NOTE — Anesthesia Postprocedure Evaluation (Signed)
Anesthesia Post Note  Patient: Danny Andrews  Procedure(s) Performed: TOTAL KNEE ARTHROPLASTY (Left: Knee)     Patient location during evaluation: PACU Anesthesia Type: Spinal Level of consciousness: oriented and awake and alert Pain management: pain level controlled Vital Signs Assessment: post-procedure vital signs reviewed and stable Respiratory status: spontaneous breathing, respiratory function stable and patient connected to nasal cannula oxygen Cardiovascular status: blood pressure returned to baseline and stable Postop Assessment: no headache, no backache and no apparent nausea or vomiting Anesthetic complications: no   No notable events documented.          Effie Berkshire
